# Patient Record
Sex: Female | Born: 1960 | Race: Black or African American | Hispanic: No | Smoking: Current every day smoker
Health system: Southern US, Community
[De-identification: ages and names within clinical notes are randomized; demographics above are authoritative.]

## PROBLEM LIST (undated history)

## (undated) DIAGNOSIS — I10 Essential (primary) hypertension: Secondary | ICD-10-CM

## (undated) DIAGNOSIS — E785 Hyperlipidemia, unspecified: Secondary | ICD-10-CM

## (undated) DIAGNOSIS — K5792 Diverticulitis of intestine, part unspecified, without perforation or abscess without bleeding: Secondary | ICD-10-CM

## (undated) DIAGNOSIS — K635 Polyp of colon: Secondary | ICD-10-CM

## (undated) DIAGNOSIS — Z8619 Personal history of other infectious and parasitic diseases: Secondary | ICD-10-CM

## (undated) DIAGNOSIS — E119 Type 2 diabetes mellitus without complications: Secondary | ICD-10-CM

## (undated) HISTORY — PX: REDUCTION MAMMAPLASTY: SUR839

## (undated) HISTORY — DX: Polyp of colon: K63.5

## (undated) HISTORY — PX: BREAST REDUCTION SURGERY: SHX8

## (undated) HISTORY — DX: Essential (primary) hypertension: I10

## (undated) HISTORY — DX: Type 2 diabetes mellitus without complications: E11.9

## (undated) HISTORY — DX: Diverticulitis of intestine, part unspecified, without perforation or abscess without bleeding: K57.92

## (undated) HISTORY — DX: Hyperlipidemia, unspecified: E78.5

## (undated) HISTORY — PX: ABDOMINAL HYSTERECTOMY: SHX81

## (undated) HISTORY — DX: Personal history of other infectious and parasitic diseases: Z86.19

---

## 2017-11-09 LAB — LIPID PANEL
CHOLESTEROL: 157 (ref 0–200)
HDL: 66 (ref 35–70)
LDL Cholesterol: 75
Triglycerides: 80 (ref 40–160)

## 2017-11-09 LAB — HEMOGLOBIN A1C: Hemoglobin A1C: 5.5

## 2018-03-25 ENCOUNTER — Encounter: Payer: Self-pay | Admitting: Family Medicine

## 2018-03-25 ENCOUNTER — Ambulatory Visit: Payer: BC Managed Care – PPO | Admitting: Family Medicine

## 2018-03-25 VITALS — BP 138/76 | HR 79 | Temp 98.2°F | Ht 64.0 in | Wt 158.0 lb

## 2018-03-25 DIAGNOSIS — L659 Nonscarring hair loss, unspecified: Secondary | ICD-10-CM

## 2018-03-25 DIAGNOSIS — Z1159 Encounter for screening for other viral diseases: Secondary | ICD-10-CM

## 2018-03-25 DIAGNOSIS — Z23 Encounter for immunization: Secondary | ICD-10-CM

## 2018-03-25 DIAGNOSIS — E119 Type 2 diabetes mellitus without complications: Secondary | ICD-10-CM | POA: Diagnosis not present

## 2018-03-25 DIAGNOSIS — E785 Hyperlipidemia, unspecified: Secondary | ICD-10-CM | POA: Diagnosis not present

## 2018-03-25 DIAGNOSIS — E1169 Type 2 diabetes mellitus with other specified complication: Secondary | ICD-10-CM | POA: Insufficient documentation

## 2018-03-25 DIAGNOSIS — E1159 Type 2 diabetes mellitus with other circulatory complications: Secondary | ICD-10-CM

## 2018-03-25 DIAGNOSIS — I152 Hypertension secondary to endocrine disorders: Secondary | ICD-10-CM

## 2018-03-25 DIAGNOSIS — R6889 Other general symptoms and signs: Secondary | ICD-10-CM

## 2018-03-25 DIAGNOSIS — Z1239 Encounter for other screening for malignant neoplasm of breast: Secondary | ICD-10-CM

## 2018-03-25 DIAGNOSIS — I10 Essential (primary) hypertension: Secondary | ICD-10-CM

## 2018-03-25 LAB — CBC
HCT: 39.3 % (ref 36.0–46.0)
Hemoglobin: 12.9 g/dL (ref 12.0–15.0)
MCHC: 32.7 g/dL (ref 30.0–36.0)
MCV: 84.9 fl (ref 78.0–100.0)
Platelets: 230 K/uL (ref 150.0–400.0)
RBC: 4.63 Mil/uL (ref 3.87–5.11)
RDW: 12.5 % (ref 11.5–15.5)
WBC: 6.3 K/uL (ref 4.0–10.5)

## 2018-03-25 LAB — TSH: TSH: 1.49 u[IU]/mL (ref 0.35–4.50)

## 2018-03-25 LAB — HEMOGLOBIN A1C: Hgb A1c MFr Bld: 5.3 % (ref 4.6–6.5)

## 2018-03-25 MED ORDER — LISINOPRIL-HYDROCHLOROTHIAZIDE 20-12.5 MG PO TABS: 1.0000 | ORAL_TABLET | Freq: Every day | ORAL | 3 refills | Status: DC

## 2018-03-25 MED ORDER — ATORVASTATIN CALCIUM 80 MG PO TABS: 80.0000 mg | ORAL_TABLET | Freq: Every day | ORAL | 3 refills | Status: DC

## 2018-03-25 MED ORDER — METFORMIN HCL 500 MG PO TABS: 500.0000 mg | ORAL_TABLET | Freq: Two times a day (BID) | ORAL | 3 refills | Status: DC

## 2018-03-25 NOTE — Patient Instructions (Signed)
We will check som labs today.   If you sign up for MyChart you can see your records  Everything looks great today!

## 2018-03-25 NOTE — Assessment & Plan Note (Signed)
Cold intolerance and hair loss concerning for possible thyroid disorder. Pt with hx of anemia and would like that checked as well - though she has a hysterectomy so this may be less likely

## 2018-03-25 NOTE — Assessment & Plan Note (Signed)
BP at goal. Will obtain outside labs.

## 2018-03-25 NOTE — Assessment & Plan Note (Signed)
Reports good control. Discussed the possibility of stopping medication if level is well into normal range for trial of diet controlled and she was interested. HgbA1c today.

## 2018-03-25 NOTE — Progress Notes (Signed)
Subjective:     Andrea Rivas is a 58 y.o. female presenting for Establish Care (previous PCP with [facility] last office visit was around October or November 2019.); Constipation (sometimes has to have a laxative to help use the bathroom.); and Lab work (discuss if certain levels can be checked.)     HPI   #Hair loss - has noticed that she is loosing hair - has noticed that she is getting chills - itchy skin   #Constipation - taking sennoside over the count - drinks 1/2 gal of water a day - interested in alternatives   #HTN - taking medication - no side effects  #diabetes - following the diet - has been low in the past - tolerating metformin   Review of Systems  Constitutional: Positive for chills. Negative for fatigue and unexpected weight change.  HENT: Negative for congestion, rhinorrhea and sore throat.   Eyes: Negative for visual disturbance.  Respiratory: Negative for shortness of breath.   Cardiovascular: Positive for palpitations. Negative for chest pain and leg swelling.  Gastrointestinal: Positive for constipation. Negative for blood in stool, diarrhea and vomiting.  Endocrine: Positive for cold intolerance. Negative for heat intolerance, polydipsia and polyuria.  Genitourinary: Negative.  Negative for hematuria.  Musculoskeletal: Negative for arthralgias and myalgias.  Neurological: Negative for light-headedness and headaches.  Psychiatric/Behavioral: The patient is not nervous/anxious.      Social History   Tobacco Use  Smoking Status Current Every Day Smoker  . Packs/day: 0.50  . Years: 30.00  . Pack years: 15.00  . Types: Cigarettes  Smokeless Tobacco Former Neurosurgeon  . Types: Snuff, Chew  . Quit date: 03/13/1980        Objective:    BP Readings from Last 3 Encounters:  03/25/18 138/76   Wt Readings from Last 3 Encounters:  03/25/18 158 lb (71.7 kg)    BP 138/76   Pulse 79   Temp 98.2 F (36.8 C)   Ht 5\' 4"   (1.626 m)   Wt 158 lb (71.7 kg)   SpO2 96%   BMI 27.12 kg/m    Physical Exam Constitutional:      General: She is not in acute distress.    Appearance: She is well-developed. She is not diaphoretic.  HENT:     Right Ear: External ear normal.     Left Ear: External ear normal.     Nose: Nose normal.  Eyes:     Conjunctiva/sclera: Conjunctivae normal.  Neck:     Musculoskeletal: Neck supple.     Thyroid: No thyroid mass, thyromegaly or thyroid tenderness.  Cardiovascular:     Rate and Rhythm: Normal rate and regular rhythm.     Heart sounds: No murmur.  Pulmonary:     Effort: Pulmonary effort is normal. No respiratory distress.     Breath sounds: Normal breath sounds.  Skin:    General: Skin is warm and dry.     Capillary Refill: Capillary refill takes less than 2 seconds.  Neurological:     Mental Status: She is alert. Mental status is at baseline.  Psychiatric:        Mood and Affect: Mood normal.        Behavior: Behavior normal.           Assessment & Plan:   Problem List Items Addressed This Visit      Cardiovascular and Mediastinum   Hypertension associated with diabetes (HCC)    BP at goal. Will obtain  outside labs.       Relevant Medications   aspirin EC 81 MG tablet   lisinopril-hydrochlorothiazide (PRINZIDE,ZESTORETIC) 20-12.5 MG tablet   metFORMIN (GLUCOPHAGE) 500 MG tablet   atorvastatin (LIPITOR) 80 MG tablet     Endocrine   Diabetes (HCC)    Reports good control. Discussed the possibility of stopping medication if level is well into normal range for trial of diet controlled and she was interested. HgbA1c today.       Relevant Medications   aspirin EC 81 MG tablet   lisinopril-hydrochlorothiazide (PRINZIDE,ZESTORETIC) 20-12.5 MG tablet   metFORMIN (GLUCOPHAGE) 500 MG tablet   atorvastatin (LIPITOR) 80 MG tablet   Other Relevant Orders   Hemoglobin A1c     Other   Hyperlipidemia    Cont med. Will follow-up outside labs      Relevant  Medications   aspirin EC 81 MG tablet   lisinopril-hydrochlorothiazide (PRINZIDE,ZESTORETIC) 20-12.5 MG tablet   atorvastatin (LIPITOR) 80 MG tablet   Cold intolerance    Cold intolerance and hair loss concerning for possible thyroid disorder. Pt with hx of anemia and would like that checked as well - though she has a hysterectomy so this may be less likely      Relevant Orders   CBC    Other Visit Diagnoses    Hair loss    -  Primary   Relevant Orders   TSH   Screening for breast cancer       Relevant Orders   MM 3D SCREEN BREAST BILATERAL   Need for hepatitis C screening test       Relevant Orders   Hepatitis C antibody       Return in about 6 months (around 09/23/2018).  Lynnda ChildJessica R Oluwadamilola Rosamond, MD

## 2018-03-25 NOTE — Assessment & Plan Note (Signed)
Cont med. Will follow-up outside labs

## 2018-03-26 LAB — HEPATITIS C ANTIBODY
Hepatitis C Ab: NONREACTIVE
SIGNAL TO CUT-OFF: 0.03 (ref <1.00)

## 2018-09-23 ENCOUNTER — Ambulatory Visit: Payer: BC Managed Care – PPO | Admitting: Family Medicine

## 2018-09-23 ENCOUNTER — Other Ambulatory Visit: Payer: Self-pay

## 2018-09-23 ENCOUNTER — Encounter: Payer: Self-pay | Admitting: Family Medicine

## 2018-09-23 VITALS — BP 148/86 | HR 69 | Temp 98.5°F | Ht 64.0 in | Wt 159.8 lb

## 2018-09-23 DIAGNOSIS — F17200 Nicotine dependence, unspecified, uncomplicated: Secondary | ICD-10-CM | POA: Diagnosis not present

## 2018-09-23 DIAGNOSIS — K5900 Constipation, unspecified: Secondary | ICD-10-CM

## 2018-09-23 DIAGNOSIS — E785 Hyperlipidemia, unspecified: Secondary | ICD-10-CM

## 2018-09-23 DIAGNOSIS — I10 Essential (primary) hypertension: Secondary | ICD-10-CM

## 2018-09-23 DIAGNOSIS — E1159 Type 2 diabetes mellitus with other circulatory complications: Secondary | ICD-10-CM | POA: Diagnosis not present

## 2018-09-23 DIAGNOSIS — E119 Type 2 diabetes mellitus without complications: Secondary | ICD-10-CM

## 2018-09-23 DIAGNOSIS — K589 Irritable bowel syndrome without diarrhea: Secondary | ICD-10-CM | POA: Insufficient documentation

## 2018-09-23 LAB — BASIC METABOLIC PANEL WITH GFR
BUN: 12 mg/dL (ref 6–23)
CO2: 29 meq/L (ref 19–32)
Calcium: 9.5 mg/dL (ref 8.4–10.5)
Chloride: 98 meq/L (ref 96–112)
Creatinine, Ser: 0.78 mg/dL (ref 0.40–1.20)
GFR: 92.03 mL/min
Glucose, Bld: 91 mg/dL (ref 70–99)
Potassium: 3.8 meq/L (ref 3.5–5.1)
Sodium: 137 meq/L (ref 135–145)

## 2018-09-23 MED ORDER — LISINOPRIL-HYDROCHLOROTHIAZIDE 20-12.5 MG PO TABS: 2.0000 | ORAL_TABLET | Freq: Every day | ORAL | 3 refills | Status: DC

## 2018-09-23 NOTE — Assessment & Plan Note (Signed)
Pt planning to work on cutting back

## 2018-09-23 NOTE — Progress Notes (Signed)
Subjective:     Andrea Rivas is a 58 y.o. female presenting for Follow-up (6 months ago)     HPI   #HTN - bp high today - does not check at home - no cp, vision changes - mild HA - taking medication - no issues with the medication   #DM - doing well - still taking metformin - tries to walk everyday  #constipation - hx of diverticulitis - taking [employer] version of miralax - water intake - does 3 20 oz bottles a day - drinks diet tea as well  #Tobacco use - planning to cut back  Review of Systems See HPI  Social History   Tobacco Use  Smoking Status Current Every Day Smoker  . Packs/day: 0.50  . Years: 30.00  . Pack years: 15.00  . Types: Cigarettes  Smokeless Tobacco Former NeurosurgeonUser  . Types: Snuff, Chew  . Quit date: 03/13/1980        Objective:    BP Readings from Last 3 Encounters:  09/23/18 (!) 148/86  03/25/18 138/76   Wt Readings from Last 3 Encounters:  09/23/18 159 lb 12 oz (72.5 kg)  03/25/18 158 lb (71.7 kg)    BP (!) 148/86   Pulse 69   Temp 98.5 F (36.9 C)   Ht 5\' 4"  (1.626 m)   Wt 159 lb 12 oz (72.5 kg)   SpO2 97%   BMI 27.42 kg/m    Physical Exam Constitutional:      General: She is not in acute distress.    Appearance: She is well-developed. She is not diaphoretic.  HENT:     Right Ear: External ear normal.     Left Ear: External ear normal.     Nose: Nose normal.  Eyes:     Conjunctiva/sclera: Conjunctivae normal.  Neck:     Musculoskeletal: Neck supple.  Cardiovascular:     Rate and Rhythm: Normal rate and regular rhythm.     Heart sounds: No murmur.  Pulmonary:     Effort: Pulmonary effort is normal. No respiratory distress.     Breath sounds: Normal breath sounds. No wheezing.  Skin:    General: Skin is warm and dry.     Capillary Refill: Capillary refill takes less than 2 seconds.  Neurological:     Mental Status: She is alert. Mental status is at baseline.  Psychiatric:        Mood and Affect: Mood  normal.        Behavior: Behavior normal.           Assessment & Plan:   Problem List Items Addressed This Visit      Cardiovascular and Mediastinum   Hypertension associated with diabetes (HCC) - Primary    BP a little elevated today. Still smoking. Encouraged cessation and discussed increasing lisinopril-HCTZ to 2 tablets (40-25mg ). Monitor for low bp and call back if low.      Relevant Medications   lisinopril-hydrochlorothiazide (ZESTORETIC) 20-12.5 MG tablet   Other Relevant Orders   Basic metabolic panel     Endocrine   Diabetes (HCC)    Last Hgb A1c was 5.3. Discussed stopping metformin and continuing diet/exercise. Will repeat in 6 months      Relevant Medications   lisinopril-hydrochlorothiazide (ZESTORETIC) 20-12.5 MG tablet     Other   Hyperlipidemia    Previous labs looked good. Has been on ASA x 10 years so discussed stopping. Continue lipitor      Relevant Medications  lisinopril-hydrochlorothiazide (ZESTORETIC) 20-12.5 MG tablet   Tobacco use disorder    Pt planning to work on cutting back      Constipation    Encouraged water, fiber, and stool softeners prn          Return in about 6 months (around 03/26/2019) for annual visit.  Lesleigh Noe, MD

## 2018-09-23 NOTE — Patient Instructions (Addendum)
#  constipation - Ok to use miralax - can use mag citrate if severe symptoms - make sure you drink lots of water  #Blood pressure - start taking 2 pills per day - check blood pressure if you can - monitor for lightheadedness, dizziness  #Diabetes - continue to exercise - stop the metformin - we will repeat labs in 6 months

## 2018-09-23 NOTE — Assessment & Plan Note (Signed)
Last Hgb A1c was 5.3. Discussed stopping metformin and continuing diet/exercise. Will repeat in 6 months

## 2018-09-23 NOTE — Assessment & Plan Note (Signed)
Previous labs looked good. Has been on ASA x 10 years so discussed stopping. Continue lipitor

## 2018-09-23 NOTE — Assessment & Plan Note (Signed)
BP a little elevated today. Still smoking. Encouraged cessation and discussed increasing lisinopril-HCTZ to 2 tablets (40-25mg ). Monitor for low bp and call back if low.

## 2018-09-23 NOTE — Assessment & Plan Note (Signed)
Encouraged water, fiber, and stool softeners prn

## 2018-12-17 NOTE — Progress Notes (Signed)
Andrea Rivas T. [name], MD Primary Care and Sports Medicine [facility] at [facility] [address] [place], [address] Phone: [phone]  FAX: [phone]  [name] - 58 y.o. female  MRN [id number]  Date of Birth: 1961/01/18  Visit Date: 12/18/2018  PCP: Lynnda Child, MD  Referred by: Lynnda Child, MD  Chief Complaint  Patient presents with  . Knee Pain    Left   Subjective:   Andrea Rivas is a 58 y.o. very pleasant female patient with Body mass index is 26.95 kg/m. who presents with the following:  [name] is a nice patient she presents with some knee pain on the Left.  She has not had any kind of specific injury.  She has no history of prior significant knee problems including no surgery, fractures, or other significant injuries.  She has had an insidious onset of left-sided knee pain.  She notices that she has puffiness in the posterior aspect of her knee, this can be variable depending on what she does.  No mechanical symptoms and no functional giving way.  She is not clear if this is worsened by going up and down stairs or getting in and out of a car.  A lot of throbbng.  Will have some pain in back. No major injuries broken.     Past Medical History, Surgical History, Social History, Family History, Problem List, Medications, and Allergies have been reviewed and updated if relevant.  Patient Active Problem List   Diagnosis Date Noted  . Tobacco use disorder 09/23/2018  . Constipation 09/23/2018  . Irritable bowel syndrome (IBS) 09/23/2018  . Hypertension associated with diabetes (HCC) 03/25/2018  . Diabetes (HCC) 03/25/2018  . Hyperlipidemia 03/25/2018  . Cold intolerance 03/25/2018    Past Medical History:  Diagnosis Date  . Benign colon polyp   . Diabetes mellitus without complication (HCC)   . Diverticulitis   . History of chicken pox   . Hyperlipidemia   . Hypertension     Past Surgical History:  Procedure  Laterality Date  . ABDOMINAL HYSTERECTOMY     still has both ovaries, cervix removed  . BREAST REDUCTION SURGERY Bilateral   . CESAREAN SECTION  1982    Social History   Socioeconomic History  . Marital status: Widowed    Spouse name: Not on file  . Number of children: 2  . Years of education: High school  . Highest education level: Not on file  Occupational History  . Not on file  Social Needs  . Financial resource strain: Not hard at all  . Food insecurity    Worry: Not on file    Inability: Not on file  . Transportation needs    Medical: Not on file    Non-medical: Not on file  Tobacco Use  . Smoking status: Current Every Day Smoker    Packs/day: 0.50    Years: 30.00    Pack years: 15.00    Types: Cigarettes  . Smokeless tobacco: Former Neurosurgeon    Types: Snuff, Dorna Bloom    Quit date: 03/13/1980  Substance and Sexual Activity  . Alcohol use: Yes    Comment: weekend, 3, 16 oz beers  . Drug use: Never  . Sexual activity: Not Currently  Lifestyle  . Physical activity    Days per week: Not on file    Minutes per session: Not on file  . Stress: Not on file  Relationships  . Social connections  Talks on phone: Not on file    Gets together: Not on file    Attends religious service: Not on file    Active member of club or organization: Not on file    Attends meetings of clubs or organizations: Not on file    Relationship status: Not on file  . Intimate partner violence    Fear of current or ex partner: Not on file    Emotionally abused: Not on file    Physically abused: Not on file    Forced sexual activity: Not on file  Other Topics Concern  . Not on file  Social History Narrative   Lives with Daughter - Randell Patient and Son Maryan Char - good relationship   5 Grandchildren - 3 girls and 2 boys age range 2-17   Enjoys: playing bingo, fishing   Retired - from school - worked as Educational psychologist for 32 years   Exercise: walking   Diet: eats a large lunch, tries to do low carb diabetic  diet    Family History  Problem Relation Age of Onset  . Diabetes Mother   . Hyperlipidemia Mother   . Hypertension Mother   . Kidney failure Mother   . Rectal cancer Mother 35  . Prostate cancer Father   . Diabetes Sister   . Hyperlipidemia Sister   . Kidney disease Brother   . Hyperlipidemia Brother   . Drug abuse Brother   . Diabetes Brother   . Alcohol abuse Brother   . Alcohol abuse Brother   . Drug abuse Brother     Allergies  Allergen Reactions  . Tramadol Itching    Medication list reviewed and updated in full in [facility].  GEN: No fevers, chills. Nontoxic. Primarily MSK c/o today. MSK: Detailed in the HPI GI: tolerating PO intake without difficulty Neuro: No numbness, parasthesias, or tingling associated. Otherwise the pertinent positives of the ROS are noted above.   Objective:   BP 124/74   Pulse 82   Temp 98.1 F (36.7 C) (Temporal)   Ht 5\' 4"  (1.626 m)   Wt 157 lb (71.2 kg)   SpO2 98%   BMI 26.95 kg/m    GEN: WDWN, NAD, Non-toxic, Alert & Oriented x 3 HEENT: Atraumatic, Normocephalic.  Ears and Nose: No external deformity. EXTR: No clubbing/cyanosis/edema NEURO: Normal gait.  PSYCH: Normally interactive. Conversant. Not depressed or anxious appearing.  Calm demeanor.    Full extension of the left knee.  Flexion to 130 degrees.  Stable to varus and valgus stress.  No pain with patellar manipulation.  Minimal joint line tenderness.  No lateral joint line tenderness.  ACL and PCL are intact.  She does have some pain with flexion pinch and McMurray's, and she does have a notable Baker's cyst posteriorly.  Radiology: No results found.  Assessment and Plan:     ICD-10-CM   1. Baker cyst, left  M71.22 DG Knee 4 Views W/Patella Left  2. Acute pain of left knee  M25.562 DG Knee 4 Views W/Patella Left  3. Primary osteoarthritis of left knee  M17.12    Patient does have mild to moderate osteoarthritis of the knee on plain film.  Reviewed  conservative care with the patient regarding Baker's cyst as well as osteoarthritis.  For now she wants to be quite conservative, and I recommended she take some NSAIDs versus Tylenol before exercise.  Also think it be helpful to use a compression brace to help with popliteal swelling post exercise.  If she becomes more symptomatic or would like to follow-up, next logical step would be to do an intra-articular injection.  Follow-up: No follow-ups on file.  No orders of the defined types were placed in this encounter.  Orders Placed This Encounter  Procedures  . DG Knee 4 Views W/Patella Left    Signed,  Soraya Paquette T. Zohal Reny, MD   Outpatient Encounter Medications as of 12/18/2018  Medication Sig  . atorvastatin (LIPITOR) 80 MG tablet Take 1 tablet (80 mg total) by mouth daily.  Marland Kitchen. lisinopril-hydrochlorothiazide (ZESTORETIC) 20-12.5 MG tablet Take 2 tablets by mouth daily.  . Multiple Vitamins-Minerals (CENTRUM SILVER PO) Take 1 tablet by mouth daily.   No facility-administered encounter medications on file as of 12/18/2018.

## 2018-12-18 ENCOUNTER — Ambulatory Visit: Payer: BC Managed Care – PPO | Admitting: Family Medicine

## 2018-12-18 ENCOUNTER — Other Ambulatory Visit: Payer: Self-pay

## 2018-12-18 ENCOUNTER — Encounter: Payer: Self-pay | Admitting: Family Medicine

## 2018-12-18 ENCOUNTER — Ambulatory Visit (INDEPENDENT_AMBULATORY_CARE_PROVIDER_SITE_OTHER)
Admission: RE | Admit: 2018-12-18 | Discharge: 2018-12-18 | Disposition: A | Discharge status: Home / Self Care | Payer: BC Managed Care – PPO | Source: Ambulatory Visit | Attending: Family Medicine | Admitting: Family Medicine

## 2018-12-18 VITALS — BP 124/74 | HR 82 | Temp 98.1°F | Ht 64.0 in | Wt 157.0 lb

## 2018-12-18 DIAGNOSIS — M7122 Synovial cyst of popliteal space [Baker], left knee: Secondary | ICD-10-CM

## 2018-12-18 DIAGNOSIS — M25562 Pain in left knee: Secondary | ICD-10-CM

## 2018-12-18 DIAGNOSIS — M1712 Unilateral primary osteoarthritis, left knee: Secondary | ICD-10-CM | POA: Diagnosis not present

## 2019-04-04 ENCOUNTER — Other Ambulatory Visit: Payer: BC Managed Care – PPO

## 2019-04-04 ENCOUNTER — Ambulatory Visit: Payer: BC Managed Care – PPO | Attending: Internal Medicine

## 2019-04-04 DIAGNOSIS — Z20822 Contact with and (suspected) exposure to covid-19: Secondary | ICD-10-CM

## 2019-04-06 LAB — NOVEL CORONAVIRUS, NAA: SARS-CoV-2, NAA: NOT DETECTED

## 2019-05-09 ENCOUNTER — Telehealth: Payer: Self-pay | Admitting: Family Medicine

## 2019-05-09 DIAGNOSIS — E785 Hyperlipidemia, unspecified: Secondary | ICD-10-CM

## 2019-05-09 DIAGNOSIS — E1159 Type 2 diabetes mellitus with other circulatory complications: Secondary | ICD-10-CM

## 2019-05-09 MED ORDER — LISINOPRIL-HYDROCHLOROTHIAZIDE 20-12.5 MG PO TABS: 2.0000 | ORAL_TABLET | Freq: Every day | ORAL | 0 refills | Status: DC

## 2019-05-09 MED ORDER — ATORVASTATIN CALCIUM 80 MG PO TABS: 80.0000 mg | ORAL_TABLET | Freq: Every day | ORAL | 0 refills | Status: DC

## 2019-05-09 NOTE — Telephone Encounter (Signed)
Patient called.  Patient has changed pharmacies from [facility] to [facility]. Patient needs a refill on Atorvastatin and Lisinopril-HCTZ. Pharmacy told her she needed to call PCP for refills since she's changing pharmacies.

## 2019-05-09 NOTE — Telephone Encounter (Signed)
Spoke with patient and advised that refills have been sent in but also patient is due for CPE and labs. Patient had lab appointment scheduled for 3/1 but I advised that she was actually due for an appointment and then we can order labs as needed. I cancelled lab appointment.  FYI to PCP

## 2019-05-12 ENCOUNTER — Other Ambulatory Visit: Payer: BC Managed Care – PPO

## 2019-05-20 ENCOUNTER — Ambulatory Visit (INDEPENDENT_AMBULATORY_CARE_PROVIDER_SITE_OTHER): Payer: BC Managed Care – PPO | Admitting: Family Medicine

## 2019-05-20 ENCOUNTER — Encounter: Payer: Self-pay | Admitting: Family Medicine

## 2019-05-20 ENCOUNTER — Other Ambulatory Visit: Payer: Self-pay

## 2019-05-20 VITALS — BP 138/82 | HR 83 | Temp 97.7°F | Ht 63.5 in | Wt 163.2 lb

## 2019-05-20 DIAGNOSIS — E1159 Type 2 diabetes mellitus with other circulatory complications: Secondary | ICD-10-CM | POA: Diagnosis not present

## 2019-05-20 DIAGNOSIS — Z136 Encounter for screening for cardiovascular disorders: Secondary | ICD-10-CM | POA: Diagnosis not present

## 2019-05-20 DIAGNOSIS — I152 Hypertension secondary to endocrine disorders: Secondary | ICD-10-CM

## 2019-05-20 DIAGNOSIS — I10 Essential (primary) hypertension: Secondary | ICD-10-CM

## 2019-05-20 DIAGNOSIS — E785 Hyperlipidemia, unspecified: Secondary | ICD-10-CM

## 2019-05-20 DIAGNOSIS — Z13228 Encounter for screening for other metabolic disorders: Secondary | ICD-10-CM

## 2019-05-20 DIAGNOSIS — E119 Type 2 diabetes mellitus without complications: Secondary | ICD-10-CM | POA: Diagnosis not present

## 2019-05-20 LAB — COMPREHENSIVE METABOLIC PANEL
ALT: 17 U/L (ref 0–35)
AST: 18 U/L (ref 0–37)
Albumin: 4.5 g/dL (ref 3.5–5.2)
Alkaline Phosphatase: 82 U/L (ref 39–117)
BUN: 14 mg/dL (ref 6–23)
CO2: 30 mEq/L (ref 19–32)
Calcium: 9.6 mg/dL (ref 8.4–10.5)
Chloride: 99 mEq/L (ref 96–112)
Creatinine, Ser: 0.85 mg/dL (ref 0.40–1.20)
GFR: 83.15 mL/min (ref >60.00)
Glucose, Bld: 115 mg/dL — ABNORMAL HIGH (ref 70–99)
Potassium: 3.8 mEq/L (ref 3.5–5.1)
Sodium: 137 mEq/L (ref 135–145)
Total Bilirubin: 0.7 mg/dL (ref 0.2–1.2)
Total Protein: 7.2 g/dL (ref 6.0–8.3)

## 2019-05-20 LAB — LIPID PANEL
Cholesterol: 211 mg/dL — ABNORMAL HIGH (ref 0–200)
HDL: 74.5 mg/dL (ref >39.00)
LDL Cholesterol: 102 mg/dL — ABNORMAL HIGH (ref 0–99)
NonHDL: 136.33
Total CHOL/HDL Ratio: 3
Triglycerides: 173 mg/dL — ABNORMAL HIGH (ref 0.0–149.0)
VLDL: 34.6 mg/dL (ref 0.0–40.0)

## 2019-05-20 LAB — HEMOGLOBIN A1C: Hgb A1c MFr Bld: 5.6 % (ref 4.6–6.5)

## 2019-05-20 MED ORDER — LISINOPRIL-HYDROCHLOROTHIAZIDE 20-12.5 MG PO TABS: 2.0000 | ORAL_TABLET | Freq: Every day | ORAL | 0 refills | Status: DC

## 2019-05-20 MED ORDER — ATORVASTATIN CALCIUM 80 MG PO TABS: 80.0000 mg | ORAL_TABLET | Freq: Every day | ORAL | 0 refills | Status: DC

## 2019-05-20 NOTE — Progress Notes (Signed)
Annual Exam   Chief Complaint:  Chief Complaint  Patient presents with  . Annual Exam    History of Present Illness:  [name] is a 59 y.o. No obstetric history on file. who LMP was No LMP recorded. Patient has had a hysterectomy., presents today for her annual examination.    #diabetes - currently diet controled - will occasionally take a pill if her numbers are higher   Nutrition She does get adequate calcium and Vitamin D in her diet. Diet: trying to do diabetic diet Taking complete vitamins - noticed that she is hungry at night Exercise: has not started   Safety The patient wears seatbelts: yes.     The patient feels safe at home and in their relationships: yes.   Menstrual S/p hysterectomy  GYN She is not sexually active.    Cervical Cancer Screening:   S/p cervix removed  Breast Cancer Screening There is no FH of breast cancer. There is no FH of ovarian cancer. BRCA screening Not Indicated.  Last Mammogram: "been a while" The patient does want a mammogram this year.    Colon Cancer Screening Age 43-75 yo - benefits outweigh the risk. Adults 76-85 yo who have never been screened benefit.  Benefits: 134000 people in 2016 will be diagnosed and 49,000 will die - early detection helps [place]: Complications 2/2 to colonoscopy High Risk (Colonoscopy): genetic disorder (Lynch syndrome or familial adenomatous polyposis), personal hx of IBD, previous adenomatous polyp, or previous colorectal cancer, FamHx start 10 years before the age at diagnosis, increased in males and black race  Options:  FIT - looks for hemoglobin (blood in the stool) - specific and fairly sensitive - must be done annually Cologuard - looks for DNA and blood - more sensitive - therefore can have more false positives, every 3 years Colonoscopy - every 10 years if normal - sedation, bowl prep, must have someone drive you  Shared decision making and the patient had decided to do  colonoscopy.  Had a colonoscopy - in [place] Screening Annual screening for adults age 19-80 yo with 30 year pack history? No Current Tobacco user? Yes Quit less than 15 years ago? No Interested in low dose CT for lung cancer screening? not applicable  Weight Wt Readings from Last 3 Encounters:  05/20/19 163 lb 4 oz (74 kg)  12/18/18 157 lb (71.2 kg)  09/23/18 159 lb 12 oz (72.5 kg)   Patient has high BMI  BMI Readings from Last 1 Encounters:  05/20/19 28.46 kg/m     Chronic disease screening Blood pressure monitoring:  BP Readings from Last 3 Encounters:  05/20/19 138/82  12/18/18 124/74  09/23/18 (!) 148/86    Lipid Monitoring: Indication for screening: age >35, obesity, diabetes, family hx, CV risk factors.  Lipid screening: Yes  Lab Results  Component Value Date   CHOL 157 11/08/2017   HDL 66 11/08/2017   LDLCALC 75 11/08/2017   TRIG 80 11/08/2017     Diabetes Screening: age >36, overweight, family hx, PCOS, hx of gestational diabetes, at risk ethnicity Diabetes Screening screening: Yes  Lab Results  Component Value Date   HGBA1C 5.3 03/25/2018     Past Medical History:  Diagnosis Date  . Benign colon polyp   . Diabetes mellitus without complication ([place])   . Diverticulitis   . History of chicken pox   . Hyperlipidemia   . Hypertension     Past Surgical History:  Procedure Laterality Date  .  ABDOMINAL HYSTERECTOMY     still has both ovaries, cervix removed  . BREAST REDUCTION SURGERY Bilateral   . CESAREAN SECTION  1982    Prior to Admission medications   Medication Sig Start Date End Date Taking? Authorizing Provider  atorvastatin (LIPITOR) 80 MG tablet Take 1 tablet (80 mg total) by mouth daily. 05/09/19  Yes Lesleigh Noe, MD  lisinopril-hydrochlorothiazide (ZESTORETIC) 20-12.5 MG tablet Take 2 tablets by mouth daily. 05/09/19  Yes Lesleigh Noe, MD  Multiple Vitamins-Minerals (CENTRUM SILVER PO) Take 1 tablet by  mouth daily.   Yes [provider]    Allergies  Allergen Reactions  . Tramadol Itching    Gynecologic History: No LMP recorded. Patient has had a hysterectomy.  Obstetric History: No obstetric history on file.  Social History   Socioeconomic History  . Marital status: Widowed    Spouse name: Not on file  . Number of children: 2  . Years of education: High school  . Highest education level: Not on file  Occupational History  . Not on file  Tobacco Use  . Smoking status: Current Every Day Smoker    Packs/day: 0.50    Years: 30.00    Pack years: 15.00    Types: Cigarettes  . Smokeless tobacco: Former Systems developer    Types: Snuff, Sarina Ser    Quit date: 03/13/1980  Substance and Sexual Activity  . Alcohol use: Yes    Comment: weekend, 3, 16 oz beers  . Drug use: Never  . Sexual activity: Not Currently  Other Topics Concern  . Not on file  Social History Narrative   Lives with Daughter - Randell Patient and Son Maryan Char - good relationship   5 Grandchildren - 3 girls and 2 boys age range 2-17   Enjoys: playing bingo, fishing   Retired - from school - worked as Educational psychologist for 32 years   Exercise: walking   Diet: eats a large lunch, tries to do low carb diabetic diet   Social Determinants of Radio broadcast assistant Strain:   . Difficulty of Paying Living Expenses: Not on file  Food Insecurity:   . Worried About Charity fundraiser in the Last Year: Not on file  . Ran Out of Food in the Last Year: Not on file  Transportation Needs:   . Lack of Transportation (Medical): Not on file  . Lack of Transportation (Non-Medical): Not on file  Physical Activity:   . Days of Exercise per Week: Not on file  . Minutes of Exercise per Session: Not on file  Stress:   . Feeling of Stress : Not on file  Social Connections:   . Frequency of Communication with Friends and Family: Not on file  . Frequency of Social Gatherings with Friends and Family: Not on file  . Attends Religious Services: Not  on file  . Active Member of Clubs or Organizations: Not on file  . Attends Archivist Meetings: Not on file  . Marital Status: Not on file  Intimate Partner Violence:   . Fear of Current or Ex-Partner: Not on file  . Emotionally Abused: Not on file  . Physically Abused: Not on file  . Sexually Abused: Not on file    Family History  Problem Relation Age of Onset  . Diabetes Mother   . Hyperlipidemia Mother   . Hypertension Mother   . Kidney failure Mother   . Rectal cancer Mother 69  . Prostate cancer Father   .  Diabetes Sister   . Hyperlipidemia Sister   . Kidney disease Brother   . Hyperlipidemia Brother   . Drug abuse Brother   . Diabetes Brother   . Alcohol abuse Brother   . Alcohol abuse Brother   . Drug abuse Brother     Review of Systems  Constitutional: Negative for chills and fever.  HENT: Negative for congestion and sore throat.   Eyes: Negative for blurred vision and double vision.  Respiratory: Negative for shortness of breath.   Cardiovascular: Negative for chest pain.  Gastrointestinal: Negative for heartburn, nausea and vomiting.  Genitourinary: Negative.   Musculoskeletal: Negative.  Negative for myalgias.  Skin: Negative for rash.  Neurological: Negative for dizziness and headaches.  Endo/Heme/Allergies: Does not bruise/bleed easily.  Psychiatric/Behavioral: Negative for depression. The patient is not nervous/anxious.      Physical Exam BP 138/82   Pulse 83   Temp 97.7 F (36.5 C)   Ht 5' 3.5" (1.613 m)   Wt 163 lb 4 oz (74 kg)   BMI 28.46 kg/m    BP Readings from Last 3 Encounters:  05/20/19 138/82  12/18/18 124/74  09/23/18 (!) 148/86      Physical Exam Constitutional:      General: She is not in acute distress.    Appearance: She is well-developed. She is not diaphoretic.  HENT:     Head: Normocephalic and atraumatic.     Right Ear: External ear normal.     Left Ear: External ear normal.     Nose: Nose normal.  Eyes:      General: No scleral icterus.    Conjunctiva/sclera: Conjunctivae normal.  Cardiovascular:     Rate and Rhythm: Normal rate and regular rhythm.     Heart sounds: No murmur.  Pulmonary:     Effort: Pulmonary effort is normal. No respiratory distress.     Breath sounds: Normal breath sounds. No wheezing.  Abdominal:     General: Bowel sounds are normal. There is no distension.     Palpations: Abdomen is soft. There is no mass.     Tenderness: There is no abdominal tenderness. There is no guarding or rebound.  Musculoskeletal:        General: Normal range of motion.     Cervical back: Neck supple.  Lymphadenopathy:     Cervical: No cervical adenopathy.  Skin:    General: Skin is warm and dry.     Capillary Refill: Capillary refill takes less than 2 seconds.     Comments: Area of concern on posterior neck: skin tag  Neurological:     Mental Status: She is alert and oriented to person, place, and time.     Deep Tendon Reflexes: Reflexes normal.  Psychiatric:        Behavior: Behavior normal.      Female chaperone present for pelvic and breast portions of the physical exam  Results:  PHQ-9:    Office Visit from 03/25/2018 in [place] at [facility]  PHQ-9 Total Score  0        Assessment: 59 y.o. No obstetric history on file. female here for routine annual physical examination.  Plan: Problem List Items Addressed This Visit      Cardiovascular and Mediastinum   Hypertension associated with diabetes ([place])   Relevant Medications   lisinopril-hydrochlorothiazide (ZESTORETIC) 20-12.5 MG tablet   atorvastatin (LIPITOR) 80 MG tablet     Endocrine   Diabetes ([place]) - Primary   Relevant Medications  lisinopril-hydrochlorothiazide (ZESTORETIC) 20-12.5 MG tablet   atorvastatin (LIPITOR) 80 MG tablet   Other Relevant Orders   Ambulatory referral to Ophthalmology   Hemoglobin A1c     Other   Hyperlipidemia   Relevant Medications    lisinopril-hydrochlorothiazide (ZESTORETIC) 20-12.5 MG tablet   atorvastatin (LIPITOR) 80 MG tablet    Other Visit Diagnoses    Encounter for special screening examination for cardiovascular disorder       Relevant Orders   Lipid panel   Screening for metabolic disorder       Relevant Orders   Comprehensive metabolic panel      Screening: -- Blood pressure screen normal -- cholesterol screening: will obtain -- Weight screening: overweight: continue to monitor -- Diabetes Screening: has been diet controled. repeat labs today. normal foot exam. encouraged healthy lifestyle -- Nutrition: normal   The 10-year ASCVD risk score Mikey Bussing DC Jr., et al., 2013) is: 21.6%*   Values used to calculate the score:     Age: 45 years     Sex: Female     Is Non-Hispanic African American: Yes     Diabetic: Yes     Tobacco smoker: Yes     Systolic Blood Pressure: 633 mmHg     Is BP treated: Yes     HDL Cholesterol: 66 mg/dL*     Total Cholesterol: 157 mg/dL*     * - Cholesterol units were assumed for this score calculation  -- Statin therapy for Age 62-75 with CVD risk >7.5%  Psych -- Depression screening (PHQ-9):    Office Visit from 03/25/2018 in [place] at [facility]  PHQ-9 Total Score  0       Safety -- tobacco screening: using, not interested in quitting at this time. encouraged cessation -- alcohol screening:  low-risk usage. -- no evidence of domestic violence or intimate partner violence.   Cancer Screening -- pap smear not collected per ASCCP guidelines - no longer has cervix -- family history of breast cancer screening: done. not at high risk. -- Mammogram - ordered  Immunizations -- flu vaccine declined -- TDAP q10 years unknown, record requested -- PPSV-23 (19-64 with chronic disease or smoking) unknown, record requested -- HPV vaccination series (Age <45 and at risk): not eligilbe   Encouraged regular vision and dental care. Smoking cessation and  healthy diet/exercise.   Lesleigh Noe, MD

## 2019-05-20 NOTE — Patient Instructions (Signed)
-  Go to the eye doctor - go to the dentist - let me know if you are ready to quit smoking and want some support - labs today     Menopause Menopause may increase your risk for:  Loss of bone (osteoporosis), which causes bone breaks (fractures).  Depression.  Hardening and narrowing of the arteries (atherosclerosis), which can cause heart attacks and strokes. What are the causes? This condition is usually caused by a natural change in hormone levels that happens as you get older. The condition may also be caused by surgery to remove both ovaries (bilateral oophorectomy). Follow these instructions at home: Lifestyle  Do not use any products that contain nicotine or tobacco, such as cigarettes and e-cigarettes. If you need help quitting, ask your health care provider.  Get at least 30 minutes of physical activity on 5 or more days each week.  Avoid alcoholic and caffeinated beverages, as well as spicy foods. This may help prevent hot flashes.  Get 7-8 hours of sleep each night.  If you have hot flashes, try: ? Dressing in layers. ? Avoiding things that may trigger hot flashes, such as spicy food, hot drinks, alcohol, caffeine, warm places, or stress. ? Taking slow, deep breaths when a hot flash starts. ? Keeping a fan in your home and office.  Find ways to manage stress: regular exercise, meditation, yoga, qigong, Tai Chi, biofeedback, acupuncture or massage  Consider going to group therapy with other women who are having menopause symptoms. Ask your health care provider about recommended group therapy meetings.  Staying cool while sleeping: dress in light clothing, use layed bedding that can be easily removed, Sleep with a fan nearby, put an ice pack under your pillow and flip your pillow regularly  Eating and drinking  Eat a healthy, balanced diet that contains whole grains, lean protein, low-fat dairy, and plenty of fruits and vegetables.  Your health care provider may  recommend adding more soy to your diet. Foods that contain soy include tofu, tempeh, and soy milk.  Eat plenty of foods that contain calcium and vitamin D for bone health. Items that are rich in calcium include low-fat milk, yogurt, beans, almonds, sardines, broccoli, and kale. Medicines  Non-prescription medications for Hot Flashes ? Soy - eat 1-2 servings of soy foods daily ? Herbs: like black cohosh have shown some improvement with hot flashes  Talk with your health care provider before starting any herbal supplements. If prescribed, take vitamins and supplements as told by your health care provider. These may include: ? Calcium. Women age 58 and older should get 1,200 mg (milligrams) of calcium every day. ? Vitamin D. Women need 600-800 International Units of vitamin D each day.  Prescription Medications ? Hormone Treatment: increased risk of breast cancer and cardiovascular disease. Should be used for a short time and in women under 58 have a lower risk overall if they take it ? Non-Hormone Treatment: Paroxetine which is an antidepressant, has been shown to reduce Hot Flashes

## 2019-05-21 ENCOUNTER — Encounter: Payer: Self-pay | Admitting: Family Medicine

## 2019-09-04 LAB — HM DIABETES EYE EXAM

## 2019-10-02 ENCOUNTER — Encounter (HOSPITAL_COMMUNITY): Payer: Self-pay | Admitting: Emergency Medicine

## 2019-10-02 ENCOUNTER — Other Ambulatory Visit: Payer: Self-pay

## 2019-10-02 ENCOUNTER — Emergency Department (HOSPITAL_COMMUNITY)
Admission: EM | Admit: 2019-10-02 | Discharge: 2019-10-03 | Disposition: A | Discharge status: Home / Self Care | Payer: BC Managed Care – PPO | Attending: Emergency Medicine | Admitting: Emergency Medicine

## 2019-10-02 DIAGNOSIS — F17210 Nicotine dependence, cigarettes, uncomplicated: Secondary | ICD-10-CM | POA: Diagnosis not present

## 2019-10-02 DIAGNOSIS — R42 Dizziness and giddiness: Secondary | ICD-10-CM | POA: Insufficient documentation

## 2019-10-02 DIAGNOSIS — R55 Syncope and collapse: Secondary | ICD-10-CM | POA: Diagnosis not present

## 2019-10-02 DIAGNOSIS — Z79899 Other long term (current) drug therapy: Secondary | ICD-10-CM | POA: Diagnosis not present

## 2019-10-02 DIAGNOSIS — E119 Type 2 diabetes mellitus without complications: Secondary | ICD-10-CM | POA: Insufficient documentation

## 2019-10-02 DIAGNOSIS — E876 Hypokalemia: Secondary | ICD-10-CM

## 2019-10-02 DIAGNOSIS — I10 Essential (primary) hypertension: Secondary | ICD-10-CM | POA: Insufficient documentation

## 2019-10-02 MED ORDER — SODIUM CHLORIDE 0.9% FLUSH: 3.0000 mL | Freq: Once | INTRAVENOUS | Status: DC

## 2019-10-02 NOTE — ED Triage Notes (Signed)
Per EMS, pt was w/ family, suddenly felt "sweaty" and sat down in a chair.  Pt has witnessed syncopal episode lasting 3 minutes.  Family stated she was confused when she first became alert.    EMS found her a/o X4.  She reports feeling "dizzy and nervous."  Does admit to 3 beers.   165/83 90 HR 100% RA 18 RR 20G L AC

## 2019-10-03 LAB — URINALYSIS, ROUTINE W REFLEX MICROSCOPIC
Bilirubin Urine: NEGATIVE
Glucose, UA: NEGATIVE mg/dL
Ketones, ur: NEGATIVE mg/dL
Nitrite: NEGATIVE
Protein, ur: NEGATIVE mg/dL
Specific Gravity, Urine: 1.006 (ref 1.005–1.030)
pH: 6 (ref 5.0–8.0)

## 2019-10-03 LAB — BASIC METABOLIC PANEL
Anion gap: 13 (ref 5–15)
BUN: 11 mg/dL (ref 6–20)
CO2: 23 mmol/L (ref 22–32)
Calcium: 9.6 mg/dL (ref 8.9–10.3)
Chloride: 99 mmol/L (ref 98–111)
Creatinine, Ser: 1.02 mg/dL — ABNORMAL HIGH (ref 0.44–1.00)
GFR calc Af Amer: >60 mL/min (ref >60)
GFR calc non Af Amer: >60 mL/min (ref >60)
Glucose, Bld: 142 mg/dL — ABNORMAL HIGH (ref 70–99)
Potassium: 2.9 mmol/L — ABNORMAL LOW (ref 3.5–5.1)
Sodium: 135 mmol/L (ref 135–145)

## 2019-10-03 LAB — CBC
HCT: 41.3 % (ref 36.0–46.0)
Hemoglobin: 12.5 g/dL (ref 12.0–15.0)
MCH: 26.9 pg (ref 26.0–34.0)
MCHC: 30.3 g/dL (ref 30.0–36.0)
MCV: 89 fL (ref 80.0–100.0)
Platelets: 196 10*3/uL (ref 150–400)
RBC: 4.64 MIL/uL (ref 3.87–5.11)
RDW: 12.2 % (ref 11.5–15.5)
WBC: 9.4 10*3/uL (ref 4.0–10.5)
nRBC: 0 % (ref 0.0–0.2)

## 2019-10-03 LAB — I-STAT BETA HCG BLOOD, ED (MC, WL, AP ONLY): I-stat hCG, quantitative: <5 m[IU]/mL (ref <5)

## 2019-10-03 MED ORDER — POTASSIUM CHLORIDE CRYS ER 20 MEQ PO TBCR
20.0000 meq | EXTENDED_RELEASE_TABLET | Freq: Every day | ORAL | 0 refills | Status: DC
Start: 2019-10-03 — End: 2019-10-06

## 2019-10-03 MED ORDER — POTASSIUM CHLORIDE CRYS ER 20 MEQ PO TBCR
40.0000 meq | EXTENDED_RELEASE_TABLET | Freq: Once | ORAL | Status: AC
  Administered 2019-10-03: 40 meq via ORAL
  Filled 2019-10-03: qty 2

## 2019-10-03 MED ORDER — ACETAMINOPHEN 500 MG PO TABS
1000.0000 mg | ORAL_TABLET | Freq: Once | ORAL | Status: AC
  Administered 2019-10-03: 1000 mg via ORAL
  Filled 2019-10-03: qty 2

## 2019-10-03 NOTE — Discharge Instructions (Addendum)
It was our pleasure to provide your ER care today - we hope that you feel better.  From today's labs, your potassium level is low (2.9), eat plenty of fruits and vegetables, take potassium supplement as prescribed, and follow up with primary care doctor in 1 week.  For fainting episode, follow up with your doctor/cardiologist in the coming week - discuss possible outpatient monitoring.   Return to ER if worse, new symptoms, fevers, chest pain, trouble breathing, fainting, new or severe pain, or other concern.

## 2019-10-03 NOTE — ED Notes (Signed)
Patient states she would like to be called "Andrea Rivas".

## 2019-10-03 NOTE — ED Provider Notes (Signed)
[facility] EMERGENCY DEPARTMENT Provider Note   CSN: [id number] Arrival date & time: 10/02/19  2335     History Chief Complaint  Patient presents with  . Loss of Consciousness    Andrea Rivas is a 59 y.o. female.  Patient with episode feeling lightheaded/faint last night, w brief syncopal event. Pt indicates she had begun to feel hot/warm, flushed, lightheaded. Denies trauma or injury with event. At time of event, no pain - no chest pain or discomfort, no headache. Denies associated palpitations. No fever. No sob or unusual doe. Denies hx syncope. Denies hx dysrhythmia. No hx cad or fam hx premature cad.  Denies any recent blood loss, vaginal bleeding, rectal bleeding or melena. No change in meds or new meds. Did indicate had consumed 3 beers shortly prior. Prolonged wait in ED due to boarding - all day in waiting room patient indicates she has felt fine (except hungry/thirsty now).   The history is provided by the patient and the EMS personnel.  Loss of Consciousness Associated symptoms: no chest pain, no confusion, no fever, no headaches, no palpitations, no shortness of breath, no vomiting and no weakness        Past Medical History:  Diagnosis Date  . Benign colon polyp   . Diabetes mellitus without complication (HCC)   . Diverticulitis   . History of chicken pox   . Hyperlipidemia   . Hypertension     Patient Active Problem List   Diagnosis Date Noted  . Tobacco use disorder 09/23/2018  . Constipation 09/23/2018  . Irritable bowel syndrome (IBS) 09/23/2018  . Hypertension associated with diabetes (HCC) 03/25/2018  . Diabetes (HCC) 03/25/2018  . Hyperlipidemia 03/25/2018  . Cold intolerance 03/25/2018    Past Surgical History:  Procedure Laterality Date  . ABDOMINAL HYSTERECTOMY     still has both ovaries, cervix removed  . BREAST REDUCTION SURGERY Bilateral   . CESAREAN SECTION  1982     OB History   No obstetric history on file.      Family History  Problem Relation Age of Onset  . Diabetes Mother   . Hyperlipidemia Mother   . Hypertension Mother   . Kidney failure Mother   . Rectal cancer Mother 61  . Prostate cancer Father   . Diabetes Sister   . Hyperlipidemia Sister   . Kidney disease Brother   . Hyperlipidemia Brother   . Drug abuse Brother   . Diabetes Brother   . Alcohol abuse Brother   . Alcohol abuse Brother   . Drug abuse Brother     Social History   Tobacco Use  . Smoking status: Current Every Day Smoker    Packs/day: 0.50    Years: 30.00    Pack years: 15.00    Types: Cigarettes  . Smokeless tobacco: Former Neurosurgeon    Types: Snuff, Dorna Bloom    Quit date: 03/13/1980  Vaping Use  . Vaping Use: Never used  Substance Use Topics  . Alcohol use: Yes    Comment: weekend, 3, 16 oz beers  . Drug use: Never    Home Medications Prior to Admission medications   Medication Sig Start Date End Date Taking? Authorizing Provider  atorvastatin (LIPITOR) 80 MG tablet Take 1 tablet (80 mg total) by mouth daily. 05/20/19   Lynnda Child, MD  lisinopril-hydrochlorothiazide (ZESTORETIC) 20-12.5 MG tablet Take 2 tablets by mouth daily. 05/20/19   Lynnda Child, MD  Multiple Vitamins-Minerals (CENTRUM SILVER PO) Take 1 tablet  by mouth daily.    [provider]    Allergies    Tramadol  Review of Systems   Review of Systems  Constitutional: Negative for chills and fever.  HENT: Negative for sore throat.   Eyes: Negative for pain and visual disturbance.  Respiratory: Negative for cough and shortness of breath.   Cardiovascular: Positive for syncope. Negative for chest pain, palpitations and leg swelling.  Gastrointestinal: Negative for abdominal pain, blood in stool, diarrhea and vomiting.  Endocrine: Negative for polyuria.  Genitourinary: Negative for dysuria and flank pain.  Musculoskeletal: Negative for back pain and neck pain.  Skin: Negative for rash.  Neurological: Negative for speech  difficulty, weakness, numbness and headaches.  Hematological: Does not bruise/bleed easily.  Psychiatric/Behavioral: Negative for confusion.    Physical Exam Updated Vital Signs BP (!) 135/60 (BP Location: Right Arm)   Pulse 70   Temp 98.1 F (36.7 C) (Oral)   Resp 18   Ht 1.6 m (5\' 3" )   Wt 74 kg   SpO2 100%   BMI 28.90 kg/m   Physical Exam Vitals and nursing note reviewed.  Constitutional:      Appearance: Normal appearance. She is well-developed.  HENT:     Head: Atraumatic.     Nose: Nose normal.     Mouth/Throat:     Mouth: Mucous membranes are moist.  Eyes:     General: No scleral icterus.    Conjunctiva/sclera: Conjunctivae normal.     Pupils: Pupils are equal, round, and reactive to light.  Neck:     Trachea: No tracheal deviation.  Cardiovascular:     Rate and Rhythm: Normal rate and regular rhythm.     Pulses: Normal pulses.     Heart sounds: Normal heart sounds. No murmur heard.  No friction rub. No gallop.   Pulmonary:     Effort: Pulmonary effort is normal. No respiratory distress.     Breath sounds: Normal breath sounds.  Abdominal:     General: Bowel sounds are normal. There is no distension.     Palpations: Abdomen is soft. There is no mass.     Tenderness: There is no abdominal tenderness. There is no guarding.  Genitourinary:    Comments: No cva tenderness.  Musculoskeletal:        General: No swelling or tenderness.     Cervical back: Normal range of motion and neck supple. No rigidity. No muscular tenderness.     Right lower leg: No edema.     Left lower leg: No edema.  Skin:    General: Skin is warm and dry.     Findings: No rash.  Neurological:     Mental Status: She is alert.     Comments: Alert, speech normal. Motor intact bil, stre 5/5. No pronator drift. Sensation grossly intact bil. Steady gait.   Psychiatric:        Mood and Affect: Mood normal.     ED Results / Procedures / Treatments   Labs (all labs ordered are listed,  but only abnormal results are displayed) Results for orders placed or performed during the hospital encounter of 10/02/19  Basic metabolic panel  Result Value Ref Range   Sodium 135 135 - 145 mmol/L   Potassium 2.9 (L) 3.5 - 5.1 mmol/L   Chloride 99 98 - 111 mmol/L   CO2 23 22 - 32 mmol/L   Glucose, Bld 142 (H) 70 - 99 mg/dL   BUN 11 6 - 20 mg/dL  Creatinine, Ser 1.02 (H) 0.44 - 1.00 mg/dL   Calcium 9.6 8.9 - 29.9 mg/dL   GFR calc non Af Amer >60 >60 mL/min   GFR calc Af Amer >60 >60 mL/min   Anion gap 13 5 - 15  CBC  Result Value Ref Range   WBC 9.4 4.0 - 10.5 K/uL   RBC 4.64 3.87 - 5.11 MIL/uL   Hemoglobin 12.5 12.0 - 15.0 g/dL   HCT 24.2 36 - 46 %   MCV 89.0 80.0 - 100.0 fL   MCH 26.9 26.0 - 34.0 pg   MCHC 30.3 30.0 - 36.0 g/dL   RDW 68.3 41.9 - 62.2 %   Platelets 196 150 - 400 K/uL   nRBC 0.0 0.0 - 0.2 %  Urinalysis, Routine w reflex microscopic  Result Value Ref Range   Color, Urine STRAW (A) YELLOW   APPearance HAZY (A) CLEAR   Specific Gravity, Urine 1.006 1.005 - 1.030   pH 6.0 5.0 - 8.0   Glucose, UA NEGATIVE NEGATIVE mg/dL   Hgb urine dipstick SMALL (A) NEGATIVE   Bilirubin Urine NEGATIVE NEGATIVE   Ketones, ur NEGATIVE NEGATIVE mg/dL   Protein, ur NEGATIVE NEGATIVE mg/dL   Nitrite NEGATIVE NEGATIVE   Leukocytes,Ua TRACE (A) NEGATIVE   RBC / HPF 0-5 0 - 5 RBC/hpf   WBC, UA 0-5 0 - 5 WBC/hpf   Bacteria, UA RARE (A) NONE SEEN   Squamous Epithelial / LPF 0-5 0 - 5  I-Stat beta hCG blood, ED  Result Value Ref Range   I-stat hCG, quantitative <5.0 <5 mIU/mL   Comment 3           EKG EKG Interpretation  Date/Time:  Thursday October 02 2019 23:38:22 EDT Ventricular Rate:  86 PR Interval:  148 QRS Duration: 80 QT Interval:  372 QTC Calculation: 445 R Axis:   7 Text Interpretation: Normal sinus rhythm Normal ECG Confirmed by Cathren Laine ([id number]) on 10/03/2019 12:37:48 PM   Radiology No results found.  Procedures Procedures (including critical care  time)  Medications Ordered in ED Medications  sodium chloride flush (NS) 0.9 % injection 3 mL (has no administration in time range)    ED Course  I have reviewed the triage vital signs and the nursing notes.  Pertinent labs & imaging results that were available during my care of the patient were reviewed by me and considered in my medical decision making (see chart for details).    MDM Rules/Calculators/A&P                          Labs sent. Ecg. Cardiac monitoring.   EMS rhythm strips reviewed, normal sinus rhythm, ems ecg without acute st or t changes.   Reviewed nursing notes and prior charts for additional history.   Labs reviewed/interpreted by me - k low 2.9. other lytes/chem normal. Hgb/wbc normal. ua neg for infection. KCL po.   Patient denies any recurrent faintness or dizziness in 12+ hours in ED. On cardiac monitor, remains in nsr, no dysrhythmia noted.   Pt asking for food/drink - provided.   Pt remains asymptomatic, and appears stable for d/c.   Rec pcp/card f/u, possible outpt monitor.   Return precautions provided.    Final Clinical Impression(s) / ED Diagnoses Final diagnoses:  None    Rx / DC Orders ED Discharge Orders    None       Cathren Laine, MD 10/03/19 1317

## 2019-10-06 ENCOUNTER — Encounter: Payer: Self-pay | Admitting: Family Medicine

## 2019-10-06 ENCOUNTER — Ambulatory Visit: Payer: BC Managed Care – PPO | Admitting: Family Medicine

## 2019-10-06 ENCOUNTER — Other Ambulatory Visit: Payer: Self-pay

## 2019-10-06 VITALS — BP 122/80 | HR 60 | Temp 97.9°F | Wt 164.5 lb

## 2019-10-06 DIAGNOSIS — E119 Type 2 diabetes mellitus without complications: Secondary | ICD-10-CM

## 2019-10-06 DIAGNOSIS — R55 Syncope and collapse: Secondary | ICD-10-CM

## 2019-10-06 DIAGNOSIS — I10 Essential (primary) hypertension: Secondary | ICD-10-CM | POA: Diagnosis not present

## 2019-10-06 DIAGNOSIS — E1159 Type 2 diabetes mellitus with other circulatory complications: Secondary | ICD-10-CM

## 2019-10-06 MED ORDER — LISINOPRIL 20 MG PO TABS: 20.0000 mg | ORAL_TABLET | Freq: Every day | ORAL | 3 refills | Status: DC

## 2019-10-06 MED ORDER — METFORMIN HCL 500 MG PO TABS: 500.0000 mg | ORAL_TABLET | Freq: Every day | ORAL | 3 refills | Status: DC

## 2019-10-06 NOTE — Assessment & Plan Note (Signed)
BP normal. Had hypoK likely 2/2 to HCTZ. Advised stopping and stopping K supplement. Prescription for lisinopril 20 mg alone to take and do home monitoring. Return 1 week for labs to check K.

## 2019-10-06 NOTE — Patient Instructions (Signed)
#   Low potassium - Stop your current blood pressure medication - Start Lisinopril 20 mg daily - Monitor blood pressure  - if blood pressure >140/90 -- mychart or call to update - Return next week for labs  #Fainting - referral to cardiology  #Referral I have placed a referral to a specialist for you. You should receive a phone call from the specialty office. Make sure your voicemail is not full and that if you are able to answer your phone to unknown or new numbers.   It may take up to 2 weeks to hear about the referral. If you do not hear anything in 2 weeks, please call our office and ask to speak with the referral coordinator.

## 2019-10-06 NOTE — Progress Notes (Signed)
Subjective:     Andrea Rivas is a 59 y.o. female presenting for Follow-up (from ER visit for syncope )     HPI   #Syncope - will feel tired if she stands for too long - got symptoms in the grocery store - water intake - doing better, 2 bottles a day 16 oz - diet sodas - 2 a day - caffeine free and sugar free soda - fainted x 3 prior to ER visit - endorses occassional heart palpations - but not related to presyncopal symptoms  Day of episode- had pizza and soda - fainting occurred when sitting, tried to get up but couldn't get up  #HTN - does not regularly check blood pressure - has also been taking her potassium tablets.    Review of Systems   Social History   Tobacco Use  Smoking Status Current Every Day Smoker  . Packs/day: 0.50  . Years: 30.00  . Pack years: 15.00  . Types: Cigarettes  Smokeless Tobacco Former Neurosurgeon  . Types: Snuff, Chew  . Quit date: 03/13/1980        Objective:    BP Readings from Last 3 Encounters:  10/06/19 122/80  10/03/19 126/77  05/20/19 138/82   Wt Readings from Last 3 Encounters:  10/06/19 164 lb 8 oz (74.6 kg)  10/02/19 163 lb 2.3 oz (74 kg)  05/20/19 163 lb 4 oz (74 kg)    BP 122/80   Pulse 60   Temp 97.9 F (36.6 C) (Temporal)   Wt 164 lb 8 oz (74.6 kg)   SpO2 98%   BMI 29.14 kg/m    Physical Exam Constitutional:      General: She is not in acute distress.    Appearance: She is well-developed. She is not diaphoretic.  HENT:     Head: Normocephalic and atraumatic.     Right Ear: External ear normal.     Left Ear: External ear normal.  Eyes:     Conjunctiva/sclera: Conjunctivae normal.  Neck:     Vascular: No carotid bruit.  Cardiovascular:     Rate and Rhythm: Normal rate and regular rhythm.     Heart sounds: No murmur heard.   Pulmonary:     Effort: Pulmonary effort is normal. No respiratory distress.     Breath sounds: Normal breath sounds. No wheezing.  Musculoskeletal:     Cervical back: Normal  range of motion and neck supple.  Skin:    General: Skin is warm and dry.     Capillary Refill: Capillary refill takes less than 2 seconds.  Neurological:     Mental Status: She is alert. Mental status is at baseline.  Psychiatric:        Mood and Affect: Mood normal.        Behavior: Behavior normal.           Assessment & Plan:   Problem List Items Addressed This Visit      Cardiovascular and Mediastinum   Hypertension associated with diabetes (HCC) - Primary    BP normal. Had hypoK likely 2/2 to HCTZ. Advised stopping and stopping K supplement. Prescription for lisinopril 20 mg alone to take and do home monitoring. Return 1 week for labs to check K.       Relevant Medications   lisinopril (ZESTRIL) 20 MG tablet   metFORMIN (GLUCOPHAGE) 500 MG tablet   Other Relevant Orders   Basic metabolic panel   Syncope    Etiology unclear - not consistent  with orthostatic symptoms. BP today is low normal so concerning for possible arrhythmia or bradycardia given symptoms with both sitting and standing position. Seizure less likely given witness/short duration/ and no memory changes or abnormal behavior - though discussed letting me know if changes. No bruit on exam. Referral to cardiology for consideration of monitoring and additional work-up. If negative may consider head imaging.       Relevant Medications   lisinopril (ZESTRIL) 20 MG tablet   Other Relevant Orders   Ambulatory referral to Cardiology     Endocrine   Diabetes [facility])    Diet controlled, but she does take metformin occasionally if her glucose runs high. Refill provided.       Relevant Medications   lisinopril (ZESTRIL) 20 MG tablet   metFORMIN (GLUCOPHAGE) 500 MG tablet       Return for after cardiology.  Lynnda Child, MD  This visit occurred during the SARS-CoV-2 public health emergency.  Safety protocols were in place, including screening questions prior to the visit, additional usage of staff PPE, and  extensive cleaning of exam room while observing appropriate contact time as indicated for disinfecting solutions.

## 2019-10-06 NOTE — Assessment & Plan Note (Signed)
Diet controlled, but she does take metformin occasionally if her glucose runs high. Refill provided.

## 2019-10-06 NOTE — Assessment & Plan Note (Signed)
Etiology unclear - not consistent with orthostatic symptoms. BP today is low normal so concerning for possible arrhythmia or bradycardia given symptoms with both sitting and standing position. Seizure less likely given witness/short duration/ and no memory changes or abnormal behavior - though discussed letting me know if changes. No bruit on exam. Referral to cardiology for consideration of monitoring and additional work-up. If negative may consider head imaging.

## 2019-10-07 NOTE — Progress Notes (Signed)
Cardiology Office Note:   Date:  10/08/2019  NAME:  Andrea Rivas    MRN: [id number] DOB:  11-25-1960   PCP:  Lynnda Childody, Jessica R, MD  Cardiologist:  No primary care provider on file.  Electrophysiologist:  None   Referring MD: Lynnda Childody, Jessica R, MD   Chief Complaint  Patient presents with  . Loss of Consciousness    History of Present Illness:   Andrea Rivas is a 59 y.o. female with a hx of DM (A1c 5.6), HTN who is being seen today for the evaluation of syncope at the request of Lynnda Childody, Jessica R, MD. Recent evaluation in the ER 10/02/2019 after syncope. Work-up in ER negative.  She reports last Thursday around 10:30 PM she had a syncopal event.  She apparently was sitting around and talking to family members and then she blacked out.  She reports that she became hot and sweaty prior to the event and then had blurry vision.  She apparently was sitting.  She reports she had had around 3 alcoholic beverages.  She had not eaten or had much to drink in terms of water that day.  She reports no chest pain or shortness of breath prior to the event.  She reports that she came to within 5 minutes.  She was back to her normal self very quickly.  She denies any defecation or incontinence around the timing of the episode.  In the emergency room her work-up was negative.  Her medical history is significant for diabetes.  She is not taking Metformin most recent A1c is 5.6.  No recent medication changes.  She remains on lisinopril and Lipitor.  She reports he has no limitations with her current level of activity.  This includes walking in around town.  She has no chest pain or shortness of breath.  She is never had any heart issues.  Her EKG today is normal sinus rhythm with no acute ST-T changes or evidence of prior infarction.  She has no murmurs on examination.  She is a current everyday smoker for about 15 years.  She reports a family history of heart disease in her mother.  She has never had a heart attack or  stroke.   Problem List 1. DM -A1c 5.6 -T chol 211, HDL 74, LDL 102, TG 173   Past Medical History: Past Medical History:  Diagnosis Date  . Benign colon polyp   . Diabetes mellitus without complication (HCC)   . Diverticulitis   . History of chicken pox   . Hyperlipidemia   . Hypertension     Past Surgical History: Past Surgical History:  Procedure Laterality Date  . ABDOMINAL HYSTERECTOMY     still has both ovaries, cervix removed  . BREAST REDUCTION SURGERY Bilateral   . CESAREAN SECTION  1982    Current Medications: Current Meds  Medication Sig  . atorvastatin (LIPITOR) 80 MG tablet Take 1 tablet (80 mg total) by mouth daily.  Marland Kitchen. lisinopril (ZESTRIL) 20 MG tablet Take 1 tablet (20 mg total) by mouth daily.  . Multiple Vitamins-Minerals (CENTRUM SILVER PO) Take 1 tablet by mouth daily.     Allergies:    Tramadol   Social History: Social History   Socioeconomic History  . Marital status: Widowed    Spouse name: Not on file  . Number of children: 2  . Years of education: High school  . Highest education level: Not on file  Occupational History  . Occupation: retired, TA  Tobacco Use  .  Smoking status: Current Every Day Smoker    Packs/day: 0.50    Years: 15.00    Pack years: 7.50    Types: Cigarettes  . Smokeless tobacco: Former Neurosurgeon    Types: Snuff, Dorna Bloom    Quit date: 03/13/1980  Vaping Use  . Vaping Use: Never used  Substance and Sexual Activity  . Alcohol use: Yes    Comment: weekend, 3, 16 oz beers  . Drug use: Never  . Sexual activity: Not Currently  Other Topics Concern  . 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 Difficulty of Paying Living Expenses:   Food Insecurity:   . Worried About Programme researcher, broadcasting/film/video in the Last Year:   . Barista in the Last Year:   Transportation Needs:   . Freight forwarder (Medical):   Marland Kitchen Lack of Transportation (Non-Medical):   Physical Activity:   . Days of Exercise per Week:   . Minutes of Exercise per Session:   Stress:   . Feeling of Stress :   Social Connections:   . Frequency of Communication with Friends and Family:   . Frequency of Social Gatherings with Friends and Family:   . Attends Religious Services:   . Active Member of Clubs or Organizations:   . Attends Banker Meetings:   Marland Kitchen Marital Status:      Family History: The patient's family history includes Alcohol abuse in her brother and brother; Diabetes in her brother, mother, and sister; Drug abuse in her brother and brother; Heart disease in her mother; Hyperlipidemia in her brother, mother, and sister; Hypertension in her mother; Kidney disease in her brother; Kidney failure in her mother; Prostate cancer in her father; Rectal cancer (age of onset: 67) in her mother.  ROS:   All other ROS reviewed and negative. Pertinent positives noted in the HPI.     EKGs/Labs/Other Studies Reviewed:   The following studies were personally reviewed by me today:  EKG:  EKG is  ordered today.  The ekg ordered today demonstrates normal sinus rhythm, heart rate 65, no acute ST-T changes, no evidence of infarction, and was personally reviewed by me.   Recent Labs: 05/20/2019: ALT 17 10/02/2019: BUN 11; Creatinine, Ser 1.02; Hemoglobin 12.5; Platelets 196; Potassium 2.9; Sodium 135   Recent Lipid Panel    Component Value Date/Time   CHOL 211 (H) 05/20/2019 0902   TRIG 173.0 (H) 05/20/2019 0902   HDL 74.50 05/20/2019 0902   CHOLHDL 3 05/20/2019 0902   VLDL 34.6 05/20/2019 0902   LDLCALC 102 (H) 05/20/2019 0902    Physical Exam:   VS:  BP 118/68   Pulse  65   Ht 5\' 3"  (1.6 m)   Wt 164 lb 12.8 oz (74.8 kg)   SpO2 100%   BMI 29.19 kg/m    Wt Readings from Last 3 Encounters:  10/08/19 164 lb 12.8 oz (74.8 kg)  10/06/19 164 lb 8 oz (74.6 kg)  10/02/19 163 lb 2.3 oz (74 kg)    General: Well  nourished, well developed, in no acute distress Heart: Atraumatic, normal size  Eyes: PEERLA, EOMI  Neck: Supple, no JVD Endocrine: No thryomegaly Cardiac: Normal S1, S2; RRR; no murmurs, rubs, or gallops Lungs: Clear to auscultation bilaterally, no wheezing, rhonchi or rales  Abd: Soft, nontender, no hepatomegaly  Ext: No edema, pulses 2+ Musculoskeletal: No deformities, BUE and BLE strength normal and equal Skin: Warm and dry, no rashes   Neuro: Alert and oriented to person, place, time, and situation, CNII-XII grossly intact, no focal deficits  Psych: Normal mood and affect   ASSESSMENT:   Andrea Rivas is a 59 y.o. female who presents for the following: 1. Syncope, unspecified syncope type   2. Hypertension associated with diabetes (HCC)   3. Hyperlipidemia, unspecified hyperlipidemia type     PLAN:   1. Syncope, unspecified syncope type -She clearly had a vasovagal event per her history.  She also was drinking alcohol and noted to have low potassium in the emergency room.  I suspect she was dehydrated.  She describes no sinister symptoms of chest pain or shortness of breath prior to the event.  No palpitations reported either.  Her EKG today shows normal sinus rhythm with no acute ST-T changes.  Her cardiovascular exam is without murmurs rubs or gallops.  We will proceed with an echocardiogram but I think this will be normal.  I see no need for any further testing such as a stress test.  I would like for her to remain well-hydrated and to continue her current medications.  I advised her to quit Metformin as her A1c is 5.6.  She does not need to have any medications on board that could lower her blood sugar as this could certainly explain her  symptoms if she took that medication.  We will see her on as-needed basis and I will follow-up the results of her ultrasound by phone.  2. Hypertension associated with diabetes (HCC) -Well-controlled on lisinopril.  3. Hyperlipidemia, unspecified hyperlipidemia type -Continue Lipitor.   Disposition: Return if symptoms worsen or fail to improve.  Medication Adjustments/Labs and Tests Ordered: Current medicines are reviewed at length with the patient today.  Concerns regarding medicines are outlined above.  Orders Placed This Encounter  Procedures  . ECHOCARDIOGRAM COMPLETE   No orders of the defined types were placed in this encounter.   Patient Instructions  Medication Instructions:  The current medical regimen is effective;  continue present plan and medications.  *If you need a refill on your cardiac medications before your next appointment, please call your pharmacy*   Testing/Procedures: Echocardiogram - Your physician has requested that you have an echocardiogram. Echocardiography is a painless test that uses sound waves to create images of your heart. It provides your doctor with information about the size and shape of your heart and how well your heart's chambers and valves are working. This procedure takes approximately one hour. There are no restrictions for this procedure. This will be performed at our [facility] location - [address].    Follow-Up: At [facility], you and your health needs are our priority.  As part of our continuing mission to provide you with exceptional heart care, we have created designated Provider Care Teams.  These Care Teams include your primary Cardiologist (physician) and Advanced Practice Providers (APPs -  Physician Assistants and Nurse Practitioners) who all work together to provide you with the care you need, when you need it.  We recommend signing up for the  patient portal called "MyChart".  Sign up information is  provided on this After Visit Summary.  MyChart is used to connect with patients for Virtual Visits (Telemedicine).  Patients are able to view lab/test results, encounter notes, upcoming appointments, etc.  Non-urgent messages can be sent to your provider as well.   To learn more about what you can do with MyChart, go to [email or web address].    Your next appointment:   As needed  The format for your next appointment:   In Person  Provider:   Lennie Odor, MD          Signed, Lenna Gilford. Flora Lipps, MD [facility]  [address], [address] [place], [place] [address] [phone]  10/08/2019 4:02 PM

## 2019-10-08 ENCOUNTER — Other Ambulatory Visit: Payer: Self-pay

## 2019-10-08 ENCOUNTER — Encounter: Payer: Self-pay | Admitting: Cardiovascular Disease

## 2019-10-08 ENCOUNTER — Ambulatory Visit: Payer: BC Managed Care – PPO | Admitting: Cardiovascular Disease

## 2019-10-08 VITALS — BP 118/68 | HR 65 | Ht 63.0 in | Wt 164.8 lb

## 2019-10-08 DIAGNOSIS — R55 Syncope and collapse: Secondary | ICD-10-CM | POA: Diagnosis not present

## 2019-10-08 DIAGNOSIS — E1159 Type 2 diabetes mellitus with other circulatory complications: Secondary | ICD-10-CM

## 2019-10-08 DIAGNOSIS — E785 Hyperlipidemia, unspecified: Secondary | ICD-10-CM | POA: Diagnosis not present

## 2019-10-08 DIAGNOSIS — I152 Hypertension secondary to endocrine disorders: Secondary | ICD-10-CM

## 2019-10-08 DIAGNOSIS — I10 Essential (primary) hypertension: Secondary | ICD-10-CM | POA: Diagnosis not present

## 2019-10-08 NOTE — Patient Instructions (Signed)
Medication Instructions:  The current medical regimen is effective;  continue present plan and medications.  *If you need a refill on your cardiac medications before your next appointment, please call your pharmacy*   Testing/Procedures: Echocardiogram - Your physician has requested that you have an echocardiogram. Echocardiography is a painless test that uses sound waves to create images of your heart. It provides your doctor with information about the size and shape of your heart and how well your heart's chambers and valves are working. This procedure takes approximately one hour. There are no restrictions for this procedure. This will be performed at our [address] location - [address], [address].    Follow-Up: At [facility], you and your health needs are our priority.  As part of our continuing mission to provide you with exceptional heart care, we have created designated Provider Care Teams.  These Care Teams include your primary Cardiologist (physician) and Advanced Practice Providers (APPs -  Physician Assistants and Nurse Practitioners) who all work together to provide you with the care you need, when you need it.  We recommend signing up for the patient portal called "MyChart".  Sign up information is provided on this After Visit Summary.  MyChart is used to connect with patients for Virtual Visits (Telemedicine).  Patients are able to view lab/test results, encounter notes, upcoming appointments, etc.  Non-urgent messages can be sent to your provider as well.   To learn more about what you can do with MyChart, go to [email or web address].    Your next appointment:   As needed  The format for your next appointment:   In Person  Provider:   Gaines O'Neal, MD      

## 2019-10-13 ENCOUNTER — Other Ambulatory Visit: Payer: Self-pay

## 2019-10-13 ENCOUNTER — Other Ambulatory Visit (INDEPENDENT_AMBULATORY_CARE_PROVIDER_SITE_OTHER): Payer: BC Managed Care – PPO

## 2019-10-13 DIAGNOSIS — E1159 Type 2 diabetes mellitus with other circulatory complications: Secondary | ICD-10-CM | POA: Diagnosis not present

## 2019-10-13 DIAGNOSIS — I10 Essential (primary) hypertension: Secondary | ICD-10-CM | POA: Diagnosis not present

## 2019-10-13 LAB — BASIC METABOLIC PANEL
BUN: 12 mg/dL (ref 6–23)
CO2: 26 mEq/L (ref 19–32)
Calcium: 9.6 mg/dL (ref 8.4–10.5)
Chloride: 106 mEq/L (ref 96–112)
Creatinine, Ser: 0.9 mg/dL (ref 0.40–1.20)
GFR: 77.73 mL/min (ref >60.00)
Glucose, Bld: 110 mg/dL — ABNORMAL HIGH (ref 70–99)
Potassium: 3.8 mEq/L (ref 3.5–5.1)
Sodium: 140 mEq/L (ref 135–145)

## 2019-10-20 NOTE — Addendum Note (Signed)
Addended by: Brunetta Genera on: 10/20/2019 11:22 AM   Modules accepted: Orders

## 2019-10-25 ENCOUNTER — Other Ambulatory Visit: Payer: Self-pay | Admitting: Family Medicine

## 2019-10-25 DIAGNOSIS — E785 Hyperlipidemia, unspecified: Secondary | ICD-10-CM

## 2019-10-27 ENCOUNTER — Other Ambulatory Visit: Payer: Self-pay

## 2019-10-27 ENCOUNTER — Ambulatory Visit (HOSPITAL_COMMUNITY): Payer: BC Managed Care – PPO | Attending: Internal Medicine

## 2019-10-27 DIAGNOSIS — R55 Syncope and collapse: Secondary | ICD-10-CM | POA: Insufficient documentation

## 2019-10-27 LAB — ECHOCARDIOGRAM COMPLETE
Area-P 1/2: 4.68 cm2
S' Lateral: 2.8 cm

## 2020-01-16 ENCOUNTER — Ambulatory Visit: Payer: BC Managed Care – PPO | Admitting: Family Medicine

## 2020-01-16 ENCOUNTER — Encounter: Payer: Self-pay | Admitting: Family Medicine

## 2020-01-16 ENCOUNTER — Other Ambulatory Visit: Payer: Self-pay

## 2020-01-16 VITALS — BP 140/90 | HR 74 | Temp 97.2°F | Ht 62.99 in | Wt 170.5 lb

## 2020-01-16 DIAGNOSIS — S39012A Strain of muscle, fascia and tendon of lower back, initial encounter: Secondary | ICD-10-CM | POA: Diagnosis not present

## 2020-01-16 DIAGNOSIS — R3915 Urgency of urination: Secondary | ICD-10-CM | POA: Diagnosis not present

## 2020-01-16 LAB — POCT UA - MICROSCOPIC ONLY

## 2020-01-16 LAB — POCT URINALYSIS DIPSTICK
Bilirubin, UA: NEGATIVE
Glucose, UA: NEGATIVE
Ketones, UA: NEGATIVE
Leukocytes, UA: NEGATIVE
Nitrite, UA: NEGATIVE
Protein, UA: NEGATIVE
Spec Grav, UA: >=1.03 — AB (ref 1.010–1.025)
Urobilinogen, UA: 0.2 E.U./dL
pH, UA: 6 (ref 5.0–8.0)

## 2020-01-16 NOTE — Assessment & Plan Note (Signed)
UA and symptoms not consistent with UTI.  May have bladder irritaion for diet... avoid irritants.Marland Kitchen stop Snapple given sugar/lemonade  or from increase glucose... work on low carb diet.  Increase water intake as urine concentrated.

## 2020-01-16 NOTE — Progress Notes (Signed)
Chief Complaint  Patient presents with   Back Pain    Pt c/o lower back pain. x32month   Urinary Urgency    x3month    History of Present Illness: HPI  59 year old female  Pt of Dr. Selena Batten with history for HTN, diabetes, IBS presents with  1 month of low back pain and urinary urgency.  She reports she was doing will until 1 month ago.. she had noted tightness in low back, pain with bending forward. Off and on. No fall, no change in activity prior.  She has noted  Increase in urine frequency during the day.   No dysuria. No abd pain.  Wt Readings from Last 3 Encounters:  01/16/20 170 lb 8 oz (77.3 kg)  10/08/19 164 lb 12.8 oz (74.8 kg)  10/06/19 164 lb 8 oz (74.6 kg)     FBS 147 Lab Results  Component Value Date   HGBA1C 5.6 05/20/2019      UA in office shows concentrated urine but no S/S of infection. No glucose in urine. There is small amount of blood on UA but  On micro it shows numerous epithelial.. contaminated sample.   This visit occurred during the SARS-CoV-2 public health emergency.  Safety protocols were in place, including screening questions prior to the visit, additional usage of staff PPE, and extensive cleaning of exam room while observing appropriate contact time as indicated for disinfecting solutions.   COVID 19 screen:  No recent travel or known exposure to COVID19 The patient denies respiratory symptoms of COVID 19 at this time. The importance of social distancing was discussed today.     Review of Systems  Constitutional: Negative for chills and fever.  HENT: Negative for congestion and ear pain.   Eyes: Negative for pain and redness.  Respiratory: Negative for cough and shortness of breath.   Cardiovascular: Negative for chest pain, palpitations and leg swelling.  Gastrointestinal: Negative for abdominal pain, blood in stool, constipation, diarrhea, nausea and vomiting.  Genitourinary: Positive for frequency and urgency. Negative for dysuria.   Musculoskeletal: Negative for falls and myalgias.  Skin: Negative for rash.  Neurological: Negative for dizziness.  Psychiatric/Behavioral: Negative for depression. The patient is not nervous/anxious.       Past Medical History:  Diagnosis Date   Benign colon polyp    Diabetes mellitus without complication (HCC)    Diverticulitis    History of chicken pox    Hyperlipidemia    Hypertension     reports that she has been smoking cigarettes. She has a 7.50 pack-year smoking history. She quit smokeless tobacco use about 39 years ago.  Her smokeless tobacco use included snuff and chew. She reports current alcohol use. She reports that she does not use drugs.   Current Outpatient Medications:    atorvastatin (LIPITOR) 80 MG tablet, Take 1 tablet by mouth once daily, Disp: 90 tablet, Rfl: 2   lisinopril (ZESTRIL) 20 MG tablet, Take 1 tablet (20 mg total) by mouth daily., Disp: 90 tablet, Rfl: 3   metFORMIN (GLUCOPHAGE) 500 MG tablet, Take 1 tablet (500 mg total) by mouth daily with breakfast., Disp: 90 tablet, Rfl: 3   Multiple Vitamins-Minerals (CENTRUM SILVER PO), Take 1 tablet by mouth daily., Disp: , Rfl:    Observations/Objective: Blood pressure 140/90, pulse 74, temperature (!) 97.2 F (36.2 C), height 5' 2.99" (1.6 m), weight 170 lb 8 oz (77.3 kg), SpO2 98 %.  Physical Exam Constitutional:      General: She is  not in acute distress.    Appearance: Normal appearance. She is well-developed. She is not ill-appearing or toxic-appearing.  HENT:     Head: Normocephalic.     Right Ear: Hearing, tympanic membrane, ear canal and external ear normal. Tympanic membrane is not erythematous, retracted or bulging.     Left Ear: Hearing, tympanic membrane, ear canal and external ear normal. Tympanic membrane is not erythematous, retracted or bulging.     Nose: No mucosal edema or rhinorrhea.     Right Sinus: No maxillary sinus tenderness or frontal sinus tenderness.     Left Sinus:  No maxillary sinus tenderness or frontal sinus tenderness.     Mouth/Throat:     Pharynx: Uvula midline.  Eyes:     General: Lids are normal. Lids are everted, no foreign bodies appreciated.     Conjunctiva/sclera: Conjunctivae normal.     Pupils: Pupils are equal, round, and reactive to light.  Neck:     Thyroid: No thyroid mass or thyromegaly.     Vascular: No carotid bruit.     Trachea: Trachea normal.  Cardiovascular:     Rate and Rhythm: Normal rate and regular rhythm.     Pulses: Normal pulses.     Heart sounds: Normal heart sounds, S1 normal and S2 normal. No murmur heard.  No friction rub. No gallop.   Pulmonary:     Effort: Pulmonary effort is normal. No tachypnea or respiratory distress.     Breath sounds: Normal breath sounds. No decreased breath sounds, wheezing, rhonchi or rales.  Abdominal:     General: Bowel sounds are normal.     Palpations: Abdomen is soft.     Tenderness: There is no abdominal tenderness.  Musculoskeletal:     Cervical back: Normal range of motion and neck supple.     Lumbar back: Tenderness present. No spasms or bony tenderness. Normal range of motion. Negative right straight leg raise test and negative left straight leg raise test.     Comments:  ttp bilateral paraspinous muscles  Skin:    General: Skin is warm and dry.     Findings: No rash.  Neurological:     Mental Status: She is alert.  Psychiatric:        Mood and Affect: Mood is not anxious or depressed.        Speech: Speech normal.        Behavior: Behavior normal. Behavior is cooperative.        Thought Content: Thought content normal.        Judgment: Judgment normal.      Assessment and Plan Urinary urgency UA and symptoms not consistent with UTI.  May have bladder irritaion for diet... avoid irritants.Marland Kitchen stop Snapple given sugar/lemonade  or from increase glucose... work on low carb diet.  Increase water intake as urine concentrated.  Strain of lumbar region treate with  NSADIs, heat and home PT.        Kerby Nora, MD

## 2020-01-16 NOTE — Patient Instructions (Signed)
Work on low [employer], exercsie and weight loss.  No clear urinary infection. Start home physical therapy and low back stretching, heat and ibuprofen 800 mg three times daily as needed for low back pain.

## 2020-01-16 NOTE — Assessment & Plan Note (Signed)
treate with NSADIs, heat and home PT.

## 2020-06-04 ENCOUNTER — Other Ambulatory Visit: Payer: Self-pay | Admitting: Family Medicine

## 2020-06-04 DIAGNOSIS — E785 Hyperlipidemia, unspecified: Secondary | ICD-10-CM

## 2020-09-28 ENCOUNTER — Other Ambulatory Visit: Payer: Self-pay | Admitting: Family Medicine

## 2020-09-28 DIAGNOSIS — I152 Hypertension secondary to endocrine disorders: Secondary | ICD-10-CM

## 2020-10-07 ENCOUNTER — Encounter: Payer: Self-pay | Admitting: Family Medicine

## 2020-10-07 ENCOUNTER — Telehealth (INDEPENDENT_AMBULATORY_CARE_PROVIDER_SITE_OTHER): Payer: Self-pay | Admitting: Family Medicine

## 2020-10-07 ENCOUNTER — Other Ambulatory Visit: Payer: Self-pay

## 2020-10-07 VITALS — Wt 162.0 lb

## 2020-10-07 DIAGNOSIS — E1159 Type 2 diabetes mellitus with other circulatory complications: Secondary | ICD-10-CM

## 2020-10-07 DIAGNOSIS — R053 Chronic cough: Secondary | ICD-10-CM

## 2020-10-07 DIAGNOSIS — I152 Hypertension secondary to endocrine disorders: Secondary | ICD-10-CM

## 2020-10-07 DIAGNOSIS — F17200 Nicotine dependence, unspecified, uncomplicated: Secondary | ICD-10-CM

## 2020-10-07 DIAGNOSIS — E1169 Type 2 diabetes mellitus with other specified complication: Secondary | ICD-10-CM

## 2020-10-07 DIAGNOSIS — E785 Hyperlipidemia, unspecified: Secondary | ICD-10-CM

## 2020-10-07 MED ORDER — ATORVASTATIN CALCIUM 80 MG PO TABS: 80.0000 mg | ORAL_TABLET | Freq: Every day | ORAL | 0 refills | Status: DC

## 2020-10-07 MED ORDER — PREDNISONE 20 MG PO TABS: 40.0000 mg | ORAL_TABLET | Freq: Every day | ORAL | 0 refills | Status: AC

## 2020-10-07 NOTE — Assessment & Plan Note (Addendum)
C/b HTN and HLD. Overdue for labs. Pt will schedule lab appointment. CBG running 130-145 - discussed need for hgba1c and if <7 reasonable to continue metformin as needed. She will return for annual and labs

## 2020-10-07 NOTE — Progress Notes (Signed)
I connected with Tekila Caillouet on 10/07/20 at  3:40 PM EDT by video and verified that I am speaking with the correct person using two identifiers.   I discussed the limitations, risks, security and privacy concerns of performing an evaluation and management service by video and the availability of in person appointments. I also discussed with the patient that there may be a patient responsible charge related to this service. The patient expressed understanding and agreed to proceed.  Patient location: Home Provider Location: [facility] Participants: Lynnda Child and Rosey Bath Shore[place]   Subjective:     Andrea Rivas is a 60 y.o. female presenting for Cough (X 1 month productive ) and Medication Refill (Atorvastatin..... lipid panel last done march of 2021)     Cough This is a new problem. The current episode started more than 1 month ago. The cough is Productive of sputum. Associated symptoms include shortness of breath (occasionally). Pertinent negatives include no chest pain, chills, fever, sore throat or wheezing.  Medication Refill Associated symptoms include coughing. Pertinent negatives include no chest pain, chills, congestion, fever or sore throat.    Worse with heat, and laying   #HTN - still taking lisinopril 20 mg - not checking bp at home - no cp, vision changes - does occasionally get headaches  #Diabetes - taking metformin as needed - if cbg >191 - checking cbg today was 174 - fasting cbg 137-145 - diabetic diet  Tobacco use - 1/2 ppd for 15 years   Review of Systems  Constitutional:  Negative for chills and fever.  HENT:  Positive for voice change (hoarse). Negative for congestion, sinus pressure, sinus pain and sore throat.   Respiratory:  Positive for cough and shortness of breath (occasionally). Negative for wheezing.   Cardiovascular:  Negative for chest pain.    Social History   Tobacco Use  Smoking Status Every Day   Packs/day:  0.50   Years: 15.00   Pack years: 7.50   Types: Cigarettes  Smokeless Tobacco Former   Types: Snuff, Chew   Quit date: 03/13/1980        Objective:   BP Readings from Last 3 Encounters:  01/16/20 140/90  10/08/19 118/68  10/06/19 122/80   Wt Readings from Last 3 Encounters:  10/07/20 162 lb (73.5 kg)  01/16/20 170 lb 8 oz (77.3 kg)  10/08/19 164 lb 12.8 oz (74.8 kg)    Wt 162 lb (73.5 kg)   BMI 28.70 kg/m   Physical Exam Constitutional:      Appearance: Normal appearance. She is not ill-appearing.  HENT:     Head: Normocephalic and atraumatic.     Right Ear: External ear normal.     Left Ear: External ear normal.  Eyes:     Conjunctiva/sclera: Conjunctivae normal.  Pulmonary:     Effort: Pulmonary effort is normal. No respiratory distress.  Neurological:     Mental Status: She is alert. Mental status is at baseline.  Psychiatric:        Mood and Affect: Mood normal.        Behavior: Behavior normal.        Thought Content: Thought content normal.        Judgment: Judgment normal.           Assessment & Plan:   Problem List Items Addressed This Visit       Cardiovascular and Mediastinum   Hypertension associated with diabetes (HCC) - Primary  Not checking bp at home. Cont lisinopril 20 mg. Discussed if cough not improved will likely switch to losartan. Advised in-person f/u within the next 1-3 months       Relevant Medications   atorvastatin (LIPITOR) 80 MG tablet     Endocrine   Type 2 diabetes mellitus with other specified complication (HCC)    C/b HTN and HLD. Overdue for labs. Pt will schedule lab appointment. CBG running 130-145 - discussed need for hgba1c and if <7 reasonable to continue metformin as needed. She will return for annual and labs       Relevant Medications   atorvastatin (LIPITOR) 80 MG tablet     Other   Hyperlipidemia    Refill atorvastatin. Return for annual and labs       Relevant Medications   atorvastatin  (LIPITOR) 80 MG tablet   Tobacco use disorder   Relevant Medications   predniSONE (DELTASONE) 20 MG tablet   Persistent cough for 3 weeks or longer    Productive in tobacco smoker w/ some sob. Suspect reactive or post viral. Course of steroids. If not improving discussed it could be lisinopril vs allergy vs gerd related.        Relevant Medications   predniSONE (DELTASONE) 20 MG tablet     Return in about 4 weeks (around 11/04/2020) for annual exam and labs.  Lynnda Child, MD

## 2020-10-07 NOTE — Assessment & Plan Note (Signed)
Productive in tobacco smoker w/ some sob. Suspect reactive or post viral. Course of steroids. If not improving discussed it could be lisinopril vs allergy vs gerd related.

## 2020-10-07 NOTE — Assessment & Plan Note (Signed)
Refill atorvastatin. Return for annual and labs

## 2020-10-07 NOTE — Assessment & Plan Note (Signed)
Not checking bp at home. Cont lisinopril 20 mg. Discussed if cough not improved will likely switch to losartan. Advised in-person f/u within the next 1-3 months

## 2020-10-11 ENCOUNTER — Telehealth: Payer: Self-pay | Admitting: Family Medicine

## 2020-12-22 ENCOUNTER — Other Ambulatory Visit: Payer: Self-pay | Admitting: Family Medicine

## 2020-12-22 DIAGNOSIS — E1159 Type 2 diabetes mellitus with other circulatory complications: Secondary | ICD-10-CM

## 2020-12-22 DIAGNOSIS — I152 Hypertension secondary to endocrine disorders: Secondary | ICD-10-CM

## 2020-12-24 NOTE — Telephone Encounter (Signed)
Please schedule follow up with dr cody. Thank you

## 2020-12-27 NOTE — Telephone Encounter (Signed)
Lvm for pt to call to schedule apt and I sent a mychart letter

## 2020-12-28 ENCOUNTER — Telehealth: Payer: Self-pay | Admitting: Family Medicine

## 2020-12-28 DIAGNOSIS — E1159 Type 2 diabetes mellitus with other circulatory complications: Secondary | ICD-10-CM

## 2020-12-28 MED ORDER — LISINOPRIL 20 MG PO TABS: 20.0000 mg | ORAL_TABLET | Freq: Every day | ORAL | 0 refills | Status: DC

## 2020-12-28 NOTE — Telephone Encounter (Signed)
Pt has apt on 10.26.22. pt also  stated she need refill on blood pressure meds she only have 1 left( lisinopril)

## 2020-12-28 NOTE — Telephone Encounter (Signed)
Pt stated that she needs a refill on lisinopril ( blood pressure meds) she only have one left . Pt also have apt now on 10.26.22 with dr. Selena Batten

## 2020-12-28 NOTE — Telephone Encounter (Signed)
Refill provided

## 2021-01-05 ENCOUNTER — Other Ambulatory Visit: Payer: Self-pay

## 2021-01-05 ENCOUNTER — Ambulatory Visit (INDEPENDENT_AMBULATORY_CARE_PROVIDER_SITE_OTHER): Payer: BC Managed Care – PPO

## 2021-01-05 ENCOUNTER — Ambulatory Visit: Payer: BC Managed Care – PPO | Admitting: Family Medicine

## 2021-01-05 VITALS — BP 182/90 | HR 63 | Temp 96.0°F | Ht 63.5 in | Wt 160.4 lb

## 2021-01-05 DIAGNOSIS — Z1231 Encounter for screening mammogram for malignant neoplasm of breast: Secondary | ICD-10-CM

## 2021-01-05 DIAGNOSIS — E1169 Type 2 diabetes mellitus with other specified complication: Secondary | ICD-10-CM | POA: Diagnosis not present

## 2021-01-05 DIAGNOSIS — I152 Hypertension secondary to endocrine disorders: Secondary | ICD-10-CM | POA: Diagnosis not present

## 2021-01-05 DIAGNOSIS — E1159 Type 2 diabetes mellitus with other circulatory complications: Secondary | ICD-10-CM

## 2021-01-05 DIAGNOSIS — R053 Chronic cough: Secondary | ICD-10-CM

## 2021-01-05 DIAGNOSIS — Z1211 Encounter for screening for malignant neoplasm of colon: Secondary | ICD-10-CM

## 2021-01-05 DIAGNOSIS — Z0000 Encounter for general adult medical examination without abnormal findings: Secondary | ICD-10-CM | POA: Diagnosis not present

## 2021-01-05 DIAGNOSIS — E785 Hyperlipidemia, unspecified: Secondary | ICD-10-CM | POA: Diagnosis not present

## 2021-01-05 DIAGNOSIS — Z114 Encounter for screening for human immunodeficiency virus [HIV]: Secondary | ICD-10-CM

## 2021-01-05 DIAGNOSIS — Z23 Encounter for immunization: Secondary | ICD-10-CM

## 2021-01-05 LAB — POCT GLYCOSYLATED HEMOGLOBIN (HGB A1C): Hemoglobin A1C: 5.7 % — AB (ref 4.0–5.6)

## 2021-01-05 LAB — COMPREHENSIVE METABOLIC PANEL
ALT: 11 U/L (ref 0–35)
AST: 15 U/L (ref 0–37)
Albumin: 4.5 g/dL (ref 3.5–5.2)
Alkaline Phosphatase: 82 U/L (ref 39–117)
BUN: 9 mg/dL (ref 6–23)
CO2: 29 mEq/L (ref 19–32)
Calcium: 9.5 mg/dL (ref 8.4–10.5)
Chloride: 106 mEq/L (ref 96–112)
Creatinine, Ser: 0.91 mg/dL (ref 0.40–1.20)
GFR: 69.04 mL/min (ref >60.00)
Glucose, Bld: 112 mg/dL — ABNORMAL HIGH (ref 70–99)
Potassium: 4 mEq/L (ref 3.5–5.1)
Sodium: 142 mEq/L (ref 135–145)
Total Bilirubin: 0.8 mg/dL (ref 0.2–1.2)
Total Protein: 7.1 g/dL (ref 6.0–8.3)

## 2021-01-05 LAB — CBC WITH DIFFERENTIAL/PLATELET
Basophils Absolute: 0 10*3/uL (ref 0.0–0.1)
Basophils Relative: 0.9 % (ref 0.0–3.0)
Eosinophils Absolute: 0.1 10*3/uL (ref 0.0–0.7)
Eosinophils Relative: 2.2 % (ref 0.0–5.0)
HCT: 40.4 % (ref 36.0–46.0)
Hemoglobin: 12.7 g/dL (ref 12.0–15.0)
Lymphocytes Relative: 37 % (ref 12.0–46.0)
Lymphs Abs: 1.8 10*3/uL (ref 0.7–4.0)
MCHC: 31.4 g/dL (ref 30.0–36.0)
MCV: 86.9 fl (ref 78.0–100.0)
Monocytes Absolute: 0.3 10*3/uL (ref 0.1–1.0)
Monocytes Relative: 6.1 % (ref 3.0–12.0)
Neutro Abs: 2.6 10*3/uL (ref 1.4–7.7)
Neutrophils Relative %: 53.8 % (ref 43.0–77.0)
Platelets: 179 10*3/uL (ref 150.0–400.0)
RBC: 4.65 Mil/uL (ref 3.87–5.11)
RDW: 12.6 % (ref 11.5–15.5)
WBC: 4.7 10*3/uL (ref 4.0–10.5)

## 2021-01-05 LAB — LIPID PANEL
Cholesterol: 194 mg/dL (ref 0–200)
HDL: 61.9 mg/dL (ref >39.00)
LDL Cholesterol: 107 mg/dL — ABNORMAL HIGH (ref 0–99)
NonHDL: 132.49
Total CHOL/HDL Ratio: 3
Triglycerides: 129 mg/dL (ref 0.0–149.0)
VLDL: 25.8 mg/dL (ref 0.0–40.0)

## 2021-01-05 LAB — TSH: TSH: 1.15 u[IU]/mL (ref 0.35–5.50)

## 2021-01-05 MED ORDER — ATORVASTATIN CALCIUM 80 MG PO TABS: 80.0000 mg | ORAL_TABLET | Freq: Every day | ORAL | 1 refills | Status: DC

## 2021-01-05 MED ORDER — LOSARTAN POTASSIUM 50 MG PO TABS: 50.0000 mg | ORAL_TABLET | Freq: Every day | ORAL | 1 refills | Status: DC

## 2021-01-05 MED ORDER — AMLODIPINE BESYLATE 5 MG PO TABS: 5.0000 mg | ORAL_TABLET | Freq: Every day | ORAL | 1 refills | Status: DC

## 2021-01-05 NOTE — Assessment & Plan Note (Signed)
BP poorly controlled. Suspect cough 2/2 to lisinopril. Stop lisinopril. Start losartan 50 mg. Add Amlodipine 5 mg. Get home cuff. Advised quitting tobacco. Return 4 months or sooner if BP >150/90 at home.

## 2021-01-05 NOTE — Assessment & Plan Note (Signed)
Lab Results  Component Value Date   HGBA1C 5.7 (A) 01/05/2021   Well controlled. Cont prn metformin

## 2021-01-05 NOTE — Assessment & Plan Note (Signed)
No improvement with prednisone. Suspect this is 2/2 to lisinopril, however, in smoker will get CXR to r/o other causes. Stop lisinopril

## 2021-01-05 NOTE — Patient Instructions (Addendum)
Please call the location of your choice from the menu below to schedule your Mammogram and/or Bone Density appointment.    [facility]   [facility]                      Phone:  [phone] [address]                               [place], [place] [address]                                                             Services: Traditional and 3D Mammogram, Bone Density    Your blood pressure high.   High blood pressure increases your risk for heart attack and stroke.    Please check your blood pressure 2-4 times a week.   To check your blood pressure 1) Sit in a quiet and relaxed place for 5 minutes 2) Make sure your feet are flat on the ground 3) Consider checking first thing in the morning   Normal blood pressure is less than 140/90 Ideally you blood pressure should be around 120/80  **Come back in 2 months if blood pressure >150/95  Other ways you can reduce your blood pressure:  1) Regular exercise -- Try to get 150 minutes (30 minutes, 5 days a week) of moderate to vigorous aerobic excercise -- Examples: brisk walking (2.5 miles per hour), water aerobics, dancing, gardening, tennis, biking slower than 10 miles per hour 2) DASH Diet - low fat meats, more fresh fruits and vegetables, whole grains, low salt 3) Quit smoking if you smoke 4) Loose 5-10% of your body weight  **Try a sound machine instead of TV   Sleep hygiene checklist: 1. Avoid naps during the day 2. Avoid stimulants such as caffeine and nicotine. Avoid bedtime alcohol (it can speed onset of sleep but the body's metabolism can cause awakenings). At least 2 hours before bedtime 3. All forms of exercise help ensure sound sleep - limit vigorous exercise to morning or late afternoon 4. Avoid food too close to bedtime including chocolate (which contains caffeine) 5. Soak up natural light 6. Establish regular bedtime routine. 7. Associate bed with sleep - avoid TV, computer  or phone, reading while in bed. 8. Ensure pleasant, relaxing sleep environment - quiet, dark, cool room.  Good Sleep Hygiene Habits -- Got to bed and wake up within an hour of the same time every day -- Avoid bright screens (from laptop, phone, TV) within at least 30 minutes before bed. The "blue light" supresses the sleep hormone melatonin and the content may stimulate as well -- Maintain a quiet and dark sleep environment (blackout curtains, turn on a fan or white noise to block out disruptive sounds) -- Practicing relaxing activites before bed (taking a shower, reading a book, journaling, meditation app) -- To quiet a busy mind -- consider journaling before bed (jotting down reminders, worry thoughts, as well as positive things like a gratitude list)   Begin a Mindfulness/Meditation practice -- this can take a little as 3 minutes -- You can find resources in books -- Or you can download apps like  ---- Headspace App (which currently has  free content called "Weathering the [employer]") ---- Calm (which has a few free options)  ---- Insignt Timer ---- Stop, Breathe & Think  # With each of these Apps - you should decline the "start free trial" offer and as you search through the App should be able to access some of their free content. You can also chose to pay for the content if you find one that works well for you.   # Many of them also offer sleep specific content which may help with insomnia

## 2021-01-05 NOTE — Progress Notes (Signed)
Annual Exam   Chief Complaint:  Chief Complaint  Rivas presents with   Annual Exam    Only concern is the lingering cough. Requesting eye exam, shingrix shots, colonoscopy and covid booster.  Wants mammogram in [place].     History of Present Illness:  [name] is a 60 y.o. No obstetric history on file. who LMP was No LMP recorded. Rivas has had a hysterectomy., presents today for her annual examination.    #HTN - taking lisinopril 20 mg - was on HCTZ but ended up with dehydration and hospital - no cp, sob, ha  #Cough - took prednisone in the summer w/o improvement - did not f/u due to insurance issue - does cough up some clear mucus - no sinus symptoms - no heartburn  Lab Results  Component Value Date   HGBA1C 5.7 (A) 01/05/2021     Nutrition She does get adequate calcium and Vitamin D in her diet. Diet: tries to be healthy Exercise: 11000 steps at steps - at [place] The Rivas wears seatbelts: yes.     The Rivas feels safe at home and in their relationships: yes.   Menstrual:  Symptoms of menopause: none S/p hysterectomy  GYN She is not sexually active.    Cervical Cancer Screening (21-65):   S/p hystectomy  Breast Cancer Screening (Age 57-74):  There is no FH of breast cancer. There is no FH of ovarian cancer. BRCA screening Not Indicated.  Last Mammogram: unknown The Rivas does want a mammogram this year.    Colon Cancer Screening:  Age 68-75 yo - benefits outweigh the risk. Adults 39-85 yo who have never been screened benefit.  Benefits: 134000 people in 2016 will be diagnosed and 49,000 will die - early detection helps [place]: Complications 2/2 to colonoscopy High Risk (Colonoscopy): genetic disorder (Lynch syndrome or familial adenomatous polyposis), personal hx of IBD, previous adenomatous polyp, or previous colorectal cancer, FamHx start 10 years before the age at diagnosis, increased in males and black  race  Options:  FIT - looks for hemoglobin (blood in the stool) - specific and fairly sensitive - must be done annually Cologuard - looks for DNA and blood - more sensitive - therefore can have more false positives, every 3 years Colonoscopy - every 10 years if normal - sedation, bowl prep, must have someone drive you  Shared decision making and the Rivas had decided to do colonoscopy - believes is overdue.   Social History   Tobacco Use  Smoking Status Every Day   Packs/day: 0.50   Years: 15.00   Pack years: 7.50   Types: Cigarettes  Smokeless Tobacco Former   Types: Snuff, Chew   Quit date: 03/13/1980    Lung Cancer Screening (Ages 38-80): yes 20 year pack history? No Current Tobacco user? Yes Quit less than 15 years ago? No Interested in low dose CT for lung cancer screening? not applicable  Weight Wt Readings from Last 3 Encounters:  01/05/21 160 lb 6 oz (72.7 kg)  10/07/20 162 lb (73.5 kg)  01/16/20 170 lb 8 oz (77.3 kg)   Rivas has high BMI  BMI Readings from Last 1 Encounters:  01/05/21 27.96 kg/m     Chronic disease screening Blood pressure monitoring:  BP Readings from Last 3 Encounters:  01/05/21 (!) 182/90  01/16/20 140/90  10/08/19 118/68    Lipid Monitoring: Indication for screening: age >21, obesity, diabetes, family hx, CV risk factors.  Lipid screening: Yes  Lab Results  Component Value Date   CHOL 211 (H) 05/20/2019   HDL 74.50 05/20/2019   LDLCALC 102 (H) 05/20/2019   TRIG 173.0 (H) 05/20/2019   CHOLHDL 3 05/20/2019     Diabetes Screening: age >42, overweight, family hx, PCOS, hx of gestational diabetes, at risk ethnicity Diabetes Screening screening: Yes  Lab Results  Component Value Date   HGBA1C 5.7 (A) 01/05/2021     Past Medical History:  Diagnosis Date   Benign colon polyp    Diabetes mellitus without complication ([place])    Diverticulitis    History of chicken pox    Hyperlipidemia    Hypertension     Past  Surgical History:  Procedure Laterality Date   ABDOMINAL HYSTERECTOMY     still has both ovaries, cervix removed   BREAST REDUCTION SURGERY Bilateral    CESAREAN SECTION  1982    Prior to Admission medications   Medication Sig Start Date End Date Taking? Authorizing Provider  atorvastatin (LIPITOR) 80 MG tablet Take 1 tablet (80 mg total) by mouth daily. Appt with PCP needed for further refills. 10/07/20  Yes Lesleigh Noe, MD  lisinopril (ZESTRIL) 20 MG tablet Take 1 tablet (20 mg total) by mouth daily. 12/28/20  Yes Lesleigh Noe, MD  metFORMIN (GLUCOPHAGE) 500 MG tablet Take 1 tablet (500 mg total) by mouth daily with breakfast. Rivas taking differently: Take 500 mg by mouth as needed (when BS is elevated). 10/06/19  Yes Lesleigh Noe, MD  Multiple Vitamins-Minerals (CENTRUM SILVER PO) Take 1 tablet by mouth daily.   Yes [provider]    Allergies  Allergen Reactions   Tramadol Itching    Gynecologic History: No LMP recorded. Rivas has had a hysterectomy.  Obstetric History: No obstetric history on file.  Social History   Socioeconomic History   Marital status: Widowed    Spouse name: Not on file   Number of children: 2   Years of education: High school   Highest education level: Not on file  Occupational History   Occupation: retired, TA  Tobacco Use   Smoking status: Every Day    Packs/day: 0.50    Years: 15.00    Pack years: 7.50    Types: Cigarettes   Smokeless tobacco: Former    Types: Snuff, Chew    Quit date: 03/13/1980  Vaping Use   Vaping Use: Never used  Substance and Sexual Activity   Alcohol use: Yes    Comment: weekend, 3, 16 oz beers   Drug use: Never   Sexual activity: Not Currently  Other Topics Concern   Not on file  Social History Narrative   Lives with Daughter - Andrea Rivas and Son Andrea Rivas - good relationship   5 Grandchildren - 3 girls and 2 boys age range 2-17   Enjoys: playing bingo, fishing   Retired - from school -  worked as Educational psychologist for 32 years   Exercise: walking   Diet: eats a large lunch, tries to do low carb diabetic diet   Social Determinants of Radio broadcast assistant Strain: Not on file  Food Insecurity: Not on file  Transportation Needs: Not on file  Physical Activity: Not on file  Stress: Not on file  Social Connections: Not on file  Intimate Partner Violence: Not on file    Family History  Problem Relation Age of Onset   Diabetes Mother    Hyperlipidemia Mother    Hypertension Mother    Kidney failure Mother  Rectal cancer Mother 36   Heart disease Mother    Prostate cancer Father    Diabetes Sister    Hyperlipidemia Sister    Kidney disease Brother    Hyperlipidemia Brother    Drug abuse Brother    Diabetes Brother    Alcohol abuse Brother    Alcohol abuse Brother    Drug abuse Brother     Review of Systems  Constitutional:  Negative for chills and fever.  HENT:  Negative for congestion and sore throat.   Eyes:  Negative for blurred vision and double vision.  Respiratory:  Positive for cough. Negative for shortness of breath.   Cardiovascular:  Negative for chest pain.  Gastrointestinal:  Negative for heartburn, nausea and vomiting.  Genitourinary: Negative.   Musculoskeletal: Negative.  Negative for myalgias.  Skin:  Negative for rash.  Neurological:  Negative for dizziness and headaches.  Endo/Heme/Allergies:  Does not bruise/bleed easily.  Psychiatric/Behavioral:  Negative for depression. The Rivas is not nervous/anxious.     Physical Exam BP (!) 182/90   Pulse 63   Temp (!) 96 F (35.6 C) (Temporal)   Ht 5' 3.5" (1.613 m)   Wt 160 lb 6 oz (72.7 kg)   SpO2 97%   BMI 27.96 kg/m    BP Readings from Last 3 Encounters:  01/05/21 (!) 182/90  01/16/20 140/90  10/08/19 118/68      Physical Exam Constitutional:      General: She is not in acute distress.    Appearance: She is well-developed. She is not diaphoretic.  HENT:     Head: Normocephalic  and atraumatic.     Right Ear: External ear normal.     Left Ear: External ear normal.     Nose: Nose normal.     Mouth/Throat:     Mouth: Mucous membranes are moist.  Eyes:     General: No scleral icterus.    Extraocular Movements: Extraocular movements intact.     Conjunctiva/sclera: Conjunctivae normal.  Cardiovascular:     Rate and Rhythm: Normal rate and regular rhythm.     Heart sounds: No murmur heard. Pulmonary:     Effort: Pulmonary effort is normal. No respiratory distress.     Breath sounds: Normal breath sounds. No wheezing.  Abdominal:     General: Bowel sounds are normal. There is no distension.     Palpations: Abdomen is soft. There is no mass.     Tenderness: There is no abdominal tenderness. There is no guarding or rebound.  Musculoskeletal:        General: Normal range of motion.     Cervical back: Neck supple.  Lymphadenopathy:     Cervical: No cervical adenopathy.  Skin:    General: Skin is warm and dry.     Capillary Refill: Capillary refill takes less than 2 seconds.  Neurological:     Mental Status: She is alert and oriented to person, place, and time.     Deep Tendon Reflexes: Reflexes normal.  Psychiatric:        Mood and Affect: Mood normal.        Behavior: Behavior normal.    Results:  PHQ-9:  [place] Office Visit from 01/05/2021 in [place] at [place]  PHQ-9 Total Score 6         Assessment: 60 y.o. No obstetric history on file. female here for routine annual physical examination.  Plan: Problem List Items Addressed This Visit  Cardiovascular and Mediastinum   Hypertension associated with diabetes ([place])    BP poorly controlled. Suspect cough 2/2 to lisinopril. Stop lisinopril. Start losartan 50 mg. Add Amlodipine 5 mg. Get home cuff. Advised quitting tobacco. Return 4 months or sooner if BP >150/90 at home.       Relevant Medications   atorvastatin (LIPITOR) 80 MG tablet   losartan (COZAAR) 50 MG tablet    amLODipine (NORVASC) 5 MG tablet   Other Relevant Orders   Comprehensive metabolic panel   CBC with Differential   TSH     Endocrine   Type 2 diabetes mellitus with other specified complication [facility])    Lab Results  Component Value Date   HGBA1C 5.7 (A) 01/05/2021  Well controlled. Cont prn metformin      Relevant Medications   atorvastatin (LIPITOR) 80 MG tablet   losartan (COZAAR) 50 MG tablet   Other Relevant Orders   POCT glycosylated hemoglobin (Hb A1C) (Completed)     Other   Hyperlipidemia   Relevant Medications   atorvastatin (LIPITOR) 80 MG tablet   losartan (COZAAR) 50 MG tablet   amLODipine (NORVASC) 5 MG tablet   Other Relevant Orders   Lipid panel   Persistent cough for 3 weeks or longer    No improvement with prednisone. Suspect this is 2/2 to lisinopril, however, in smoker will get CXR to r/o other causes. Stop lisinopril      Relevant Orders   DG Chest 2 View   Other Visit Diagnoses     Annual physical exam    -  Primary   Need for influenza vaccination       Relevant Orders   Flu Vaccine QUAD 19moIM (Fluarix, Fluzone & Alfiuria Quad PF)   Encounter for screening mammogram for malignant neoplasm of breast       Relevant Orders   MM Digital Screening   Colon cancer screening       Relevant Orders   Ambulatory referral to Gastroenterology   Encounter for screening for HIV       Relevant Orders   HIV Antibody (routine testing w rflx)       Screening: -- Blood pressure screen  see plan -- cholesterol screening: will obtain -- Weight screening: overweight: continue to monitor -- Diabetes Screening: will obtain -- Nutrition: Encouraged healthy diet  The 10-year ASCVD risk score (Arnett DK, et al., 2019) is: 43.5%   Values used to calculate the score:     Age: [age]years     Sex: Female     Is Non-Hispanic African American: Yes     Diabetic: Yes     Tobacco smoker: Yes     Systolic Blood Pressure: 1009mmHg     Is BP treated: No     HDL  Cholesterol: 74.5 mg/dL     Total Cholesterol: 211 mg/dL  -- Statin therapy for Age 59-75with CVD risk >7.5%  Psych -- Depression screening (PHQ-9):  [place] [place] Total Score 6        Safety -- tobacco screening:  using, not interested in quitting -- alcohol screening:  low-risk usage. -- no evidence of domestic violence or intimate partner violence.   Cancer Screening -- pap smear not collected per ASCCP guidelines -- family history of breast cancer screening: done. not at high risk. -- Mammogram - ordered -- Colon cancer (age 59+--  referral placed, requesting records  Immunizations Immunization  History  Administered Date(s) Administered   Influenza,inj,Quad PF,6+ Mos 03/25/2018   Moderna Sars-Covid-2 Vaccination 09/30/2019, 10/28/2019   PPD Test 10/30/2016    -- flu vaccine up to date -- TDAP q10 years not up to date - requesting -- Shingles (age >18) - requesting -- PPSV-23 (19-64 with chronic disease or smoking) unknown, record requested -- PCV-13 (age >70) - one dose followed by PPSV-23 1 year later unknown, record requested -- Covid-19 Vaccine up to date   Encouraged healthy diet and exercise. Encouraged regular vision and dental care.    Lesleigh Noe, MD

## 2021-01-06 DIAGNOSIS — Z23 Encounter for immunization: Secondary | ICD-10-CM

## 2021-01-06 LAB — HIV ANTIBODY (ROUTINE TESTING W REFLEX): HIV 1&2 Ab, 4th Generation: NONREACTIVE

## 2021-04-21 ENCOUNTER — Ambulatory Visit
Admission: RE | Admit: 2021-04-21 | Discharge: 2021-04-21 | Disposition: A | Discharge status: Home / Self Care | Payer: BC Managed Care – PPO | Source: Ambulatory Visit | Attending: Family Medicine | Admitting: Family Medicine

## 2021-04-21 ENCOUNTER — Other Ambulatory Visit: Payer: Self-pay

## 2021-04-21 DIAGNOSIS — Z1231 Encounter for screening mammogram for malignant neoplasm of breast: Secondary | ICD-10-CM

## 2021-04-25 ENCOUNTER — Other Ambulatory Visit: Payer: Self-pay | Admitting: Family Medicine

## 2021-04-25 DIAGNOSIS — R928 Other abnormal and inconclusive findings on diagnostic imaging of breast: Secondary | ICD-10-CM

## 2021-05-18 ENCOUNTER — Other Ambulatory Visit: Payer: Self-pay | Admitting: Family Medicine

## 2021-05-18 ENCOUNTER — Ambulatory Visit
Admission: RE | Admit: 2021-05-18 | Discharge: 2021-05-18 | Disposition: A | Discharge status: Home / Self Care | Payer: BC Managed Care – PPO | Source: Ambulatory Visit | Attending: Family Medicine | Admitting: Family Medicine

## 2021-05-18 DIAGNOSIS — R921 Mammographic calcification found on diagnostic imaging of breast: Secondary | ICD-10-CM

## 2021-05-18 DIAGNOSIS — R928 Other abnormal and inconclusive findings on diagnostic imaging of breast: Secondary | ICD-10-CM

## 2021-05-30 ENCOUNTER — Other Ambulatory Visit (HOSPITAL_COMMUNITY): Payer: Self-pay | Admitting: Diagnostic Radiology

## 2021-05-30 ENCOUNTER — Ambulatory Visit
Admission: RE | Admit: 2021-05-30 | Discharge: 2021-05-30 | Disposition: A | Discharge status: Home / Self Care | Payer: BC Managed Care – PPO | Source: Ambulatory Visit | Attending: Family Medicine | Admitting: Family Medicine

## 2021-05-30 DIAGNOSIS — R921 Mammographic calcification found on diagnostic imaging of breast: Secondary | ICD-10-CM

## 2021-06-30 ENCOUNTER — Other Ambulatory Visit: Payer: Self-pay | Admitting: Family Medicine

## 2021-06-30 DIAGNOSIS — E119 Type 2 diabetes mellitus without complications: Secondary | ICD-10-CM

## 2021-06-30 DIAGNOSIS — E785 Hyperlipidemia, unspecified: Secondary | ICD-10-CM

## 2021-06-30 DIAGNOSIS — E1159 Type 2 diabetes mellitus with other circulatory complications: Secondary | ICD-10-CM

## 2021-09-23 ENCOUNTER — Other Ambulatory Visit: Payer: Self-pay | Admitting: Family Medicine

## 2021-09-23 DIAGNOSIS — E1159 Type 2 diabetes mellitus with other circulatory complications: Secondary | ICD-10-CM

## 2021-09-23 DIAGNOSIS — E785 Hyperlipidemia, unspecified: Secondary | ICD-10-CM

## 2021-10-06 ENCOUNTER — Ambulatory Visit (INDEPENDENT_AMBULATORY_CARE_PROVIDER_SITE_OTHER): Payer: BC Managed Care – PPO | Admitting: Family Medicine

## 2021-10-06 VITALS — BP 132/68 | HR 80 | Temp 97.4°F | Ht 64.0 in | Wt 165.5 lb

## 2021-10-06 DIAGNOSIS — E1159 Type 2 diabetes mellitus with other circulatory complications: Secondary | ICD-10-CM

## 2021-10-06 DIAGNOSIS — E119 Type 2 diabetes mellitus without complications: Secondary | ICD-10-CM | POA: Diagnosis not present

## 2021-10-06 DIAGNOSIS — E1169 Type 2 diabetes mellitus with other specified complication: Secondary | ICD-10-CM | POA: Diagnosis not present

## 2021-10-06 DIAGNOSIS — Z23 Encounter for immunization: Secondary | ICD-10-CM

## 2021-10-06 DIAGNOSIS — I152 Hypertension secondary to endocrine disorders: Secondary | ICD-10-CM

## 2021-10-06 LAB — POCT GLYCOSYLATED HEMOGLOBIN (HGB A1C): Hemoglobin A1C: 6.6 % — AB (ref 4.0–5.6)

## 2021-10-06 NOTE — Assessment & Plan Note (Signed)
Lab Results  Component Value Date   HGBA1C 6.6 (A) 10/06/2021  Hemoglobin A1c is worse from the fall, likely in the setting of poor adherence to metformin.  Discussed restarting daily metformin 500 mg.  Follow-up in 4 months for recheck.

## 2021-10-06 NOTE — Progress Notes (Signed)
   Subjective:     Andrea Rivas is a 61 y.o. female presenting for Follow-up (BP)     HPI  #Diabetes Currently taking metformin as needed - not often at all Using medications without difficulties: Yes Hypoglycemic episodes:No  Hyperglycemic episodes:No  Feet problems:No  Blood Sugars averaging: 130 fasting Last HgbA1c:  Lab Results  Component Value Date   HGBA1C 6.6 (A) 10/06/2021    Diabetes Health Maintenance Due:    Diabetes Health Maintenance Due  Topic Date Due   OPHTHALMOLOGY EXAM  09/03/2020   FOOT EXAM  01/05/2022   HEMOGLOBIN A1C  04/08/2022    #HTN - does not check  - no cp, ha, vision changes - continues to take losartan and amlodpine    Review of Systems   Social History   Tobacco Use  Smoking Status Every Day   Packs/day: 0.50   Years: 15.00   Total pack years: 7.50   Types: Cigarettes  Smokeless Tobacco Former   Types: Snuff, Chew   Quit date: 03/13/1980        Objective:    BP Readings from Last 3 Encounters:  10/06/21 132/68  01/05/21 (!) 182/90  01/16/20 140/90   Wt Readings from Last 3 Encounters:  10/06/21 165 lb 8 oz (75.1 kg)  01/05/21 160 lb 6 oz (72.7 kg)  10/07/20 162 lb (73.5 kg)    BP 132/68   Pulse 80   Temp (!) 97.4 F (36.3 C) (Temporal)   Ht 5\' 4"  (1.626 m)   Wt 165 lb 8 oz (75.1 kg)   SpO2 97%   BMI 28.41 kg/m    Physical Exam        Assessment & Plan:   Problem List Items Addressed This Visit       Cardiovascular and Mediastinum   Hypertension associated with diabetes ([facility]) - Primary    Controlled.  Continue amlodipine 5 mg and losartan 50 mg.        Endocrine   Type 2 diabetes mellitus with other specified complication ([facility])    Lab Results  Component Value Date   HGBA1C 6.6 (A) 10/06/2021  Hemoglobin A1c is worse from the fall, likely in the setting of poor adherence to metformin.  Discussed restarting daily metformin 500 mg.  Follow-up in 4 months for recheck.       Other  Visit Diagnoses     Type 2 diabetes mellitus without complication, without long-term current use of insulin (HCC)       Relevant Orders   POCT glycosylated hemoglobin (Hb A1C) (Completed)   Need for Tdap vaccination       Relevant Orders   Tdap vaccine greater than or equal to 7yo IM (Completed)        Return in about 3 months (around 01/06/2022) for TOC with Matt - annual if able.  01/08/2022, MD

## 2021-10-06 NOTE — Assessment & Plan Note (Signed)
Controlled.  Continue amlodipine 5 mg and losartan 50 mg.

## 2021-10-06 NOTE — Patient Instructions (Signed)
Get your Eye exam - including Diabetic screening - have them send [place] the records

## 2021-10-07 ENCOUNTER — Telehealth: Payer: Self-pay | Admitting: Family Medicine

## 2021-10-07 NOTE — Telephone Encounter (Signed)
Patient called to schedule a TB test. Call back number [phone].

## 2021-10-10 NOTE — Telephone Encounter (Signed)
Ok for tb skin test

## 2021-10-10 NOTE — Telephone Encounter (Signed)
Andrea Rivas verbally says its for work. Pt scheduled for nurse visit on 10/12/21. Need verbal orders.

## 2021-10-12 ENCOUNTER — Other Ambulatory Visit: Payer: Self-pay | Admitting: Family Medicine

## 2021-10-12 ENCOUNTER — Other Ambulatory Visit (INDEPENDENT_AMBULATORY_CARE_PROVIDER_SITE_OTHER): Payer: BC Managed Care – PPO

## 2021-10-12 ENCOUNTER — Ambulatory Visit: Payer: BC Managed Care – PPO

## 2021-10-12 DIAGNOSIS — Z111 Encounter for screening for respiratory tuberculosis: Secondary | ICD-10-CM | POA: Diagnosis not present

## 2021-10-12 LAB — HM DIABETES EYE EXAM

## 2021-10-12 NOTE — Progress Notes (Signed)
Problem List Items Addressed This Visit   None Visit Diagnoses     Screening-pulmonary TB    -  Primary   Relevant Orders   QuantiFERON-TB Gold Plus

## 2021-10-13 DIAGNOSIS — Z0279 Encounter for issue of other medical certificate: Secondary | ICD-10-CM

## 2021-10-13 NOTE — Telephone Encounter (Signed)
Type of forms received:TB screening form  Routed to: [employer] received by : Lesly Rubenstein   Individual made aware of 3-5 business day turn around (Y/N): Y  Form completed and patient made aware of charges(Y/N): Y   Faxed to :   Form location: Place in PCP folder

## 2021-10-13 NOTE — Telephone Encounter (Signed)
Forms received. One page is for patient to fill out and sign prior to faxing forms. Page placed up front and pt notified of form to fill out and then get back to me.

## 2021-10-15 LAB — QUANTIFERON-TB GOLD PLUS
Mitogen-NIL: >10 IU/mL
NIL: 0.02 IU/mL
QuantiFERON-TB Gold Plus: NEGATIVE
TB1-NIL: 0 IU/mL
TB2-NIL: 0 IU/mL

## 2021-10-17 ENCOUNTER — Telehealth: Payer: Self-pay | Admitting: Family Medicine

## 2021-10-17 ENCOUNTER — Encounter: Payer: Self-pay | Admitting: Family Medicine

## 2021-10-17 NOTE — Telephone Encounter (Signed)
Patient came in office to fill out TB test and dropped off eye exam form to be scan in chart and has been placed in PCP folder.

## 2021-10-17 NOTE — Telephone Encounter (Signed)
Forms for pt's work and eye exam (Has been abstracted) placed in providers office box to sign

## 2022-03-29 ENCOUNTER — Other Ambulatory Visit: Payer: Self-pay

## 2022-03-29 DIAGNOSIS — I152 Hypertension secondary to endocrine disorders: Secondary | ICD-10-CM

## 2022-03-29 MED ORDER — LOSARTAN POTASSIUM 50 MG PO TABS: 50.0000 mg | ORAL_TABLET | Freq: Every day | ORAL | 0 refills | Status: DC

## 2022-03-29 MED ORDER — AMLODIPINE BESYLATE 5 MG PO TABS: 5.0000 mg | ORAL_TABLET | Freq: Every day | ORAL | 0 refills | Status: DC

## 2022-04-26 ENCOUNTER — Other Ambulatory Visit: Payer: Self-pay

## 2022-04-26 DIAGNOSIS — E785 Hyperlipidemia, unspecified: Secondary | ICD-10-CM

## 2022-04-26 MED ORDER — ATORVASTATIN CALCIUM 80 MG PO TABS: 80.0000 mg | ORAL_TABLET | Freq: Every day | ORAL | 0 refills | Status: DC

## 2022-05-01 ENCOUNTER — Telehealth: Payer: Self-pay

## 2022-05-01 NOTE — Telephone Encounter (Signed)
Please call patient to see if she is going to set up [facility] appointment with this office. If not let me know so we can removed Dr. Einar Pheasant as PCP

## 2022-05-29 NOTE — Progress Notes (Signed)
Subjective:    Andrea Rivas - 62 y.o. female MRN [id number]  Date of birth: 1960/12/28  HPI  Andrea Rivas is to establish care.   Current issues and/or concerns: - Doing well on blood pressure medications, no issues/concerns. She does not check her blood pressure outside of office. She does monitor salt intake. She does not exercise outside of her normal routine. She denies red flag symptoms such as but not limited to chest pain, shortness of breath, worst headache of life, nausea/vomiting.  - Doing well on cholesterol medication, no issues/concerns.  - States taking Metformin only as needed. She does monitor sugar/carbohydrates intake.  - No further issues/concerns for discussion today.   ROS per HPI    Health Maintenance:  Health Maintenance Due  Topic Date Due   Diabetic kidney evaluation - Urine ACR  Never done   COLONOSCOPY (Pts 45-13yrs Insurance coverage will need to be confirmed)  Never done   COVID-19 Vaccine (4 - 2023-24 season) 11/11/2021   Diabetic kidney evaluation - eGFR measurement  01/05/2022   FOOT EXAM  01/05/2022   HEMOGLOBIN A1C  04/08/2022     Past Medical History: Patient Active Problem List   Diagnosis Date Noted   Urinary urgency 01/16/2020   Strain of lumbar region 01/16/2020   Syncope 10/06/2019   Tobacco use disorder 09/23/2018   Constipation 09/23/2018   Irritable bowel syndrome (IBS) 09/23/2018   Hypertension associated with diabetes ([place]) 03/25/2018   Type 2 diabetes mellitus with other specified complication ([place]) 0000000   Hyperlipidemia 03/25/2018   Cold intolerance 03/25/2018      Social History   reports that she has been smoking cigarettes. She has a 7.50 pack-year smoking history. She quit smokeless tobacco use about 42 years ago.  Her smokeless tobacco use included snuff and chew. She reports current alcohol use of about 3.0 standard drinks of alcohol per week. She reports that she does not use drugs.   Family History   family history includes Alcohol abuse in her brother and brother; Diabetes in her brother, mother, and sister; Drug abuse in her brother and brother; Heart disease in her mother; Hyperlipidemia in her brother, mother, and sister; Hypertension in her mother; Kidney disease in her brother; Kidney failure in her mother; Prostate cancer in her father; Rectal cancer (age of onset: 70) in her mother.   Medications: reviewed and updated   Objective:   Physical Exam BP 134/86 (BP Location: Left Arm, Patient Position: Sitting, Cuff Size: Large)   Pulse 90   Temp 98.3 F (36.8 C)   Resp 16   Ht 5' 3.39" (1.61 m)   Wt 160 lb (72.6 kg)   SpO2 96%   BMI 28.00 kg/m   Physical Exam HENT:     Head: Normocephalic and atraumatic.  Eyes:     Extraocular Movements: Extraocular movements intact.     Conjunctiva/sclera: Conjunctivae normal.     Pupils: Pupils are equal, round, and reactive to light.  Cardiovascular:     Rate and Rhythm: Normal rate and regular rhythm.     Pulses: Normal pulses.     Heart sounds: Normal heart sounds.  Pulmonary:     Effort: Pulmonary effort is normal.     Breath sounds: Normal breath sounds.  Musculoskeletal:     Cervical back: Normal range of motion and neck supple.  Neurological:     General: No focal deficit present.     Mental Status: She is alert and oriented to person,  place, and time.  Psychiatric:        Mood and Affect: Mood normal.        Behavior: Behavior normal.         Assessment & Plan:  1. Encounter to establish care - Patient presents today to establish care. During the interim follow-up with primary provider as scheduled.  - Return for annual physical examination, labs, and health maintenance. Arrive fasting meaning having no food for at least 8 hours prior to appointment. You may have only water or black coffee. Please take scheduled medications as normal.  2. Primary hypertension - Continue Amlodipine and Losartan as prescribed.  -  Routine screening.  - Counseled on blood pressure goal of less than 130/80, low-sodium, DASH diet, medication compliance, and 150 minutes of moderate intensity exercise per week as tolerated. Counseled on medication adherence and adverse effects. - Follow-up with primary provider in 3 months or sooner if needed. - Basic Metabolic Panel - amLODipine (NORVASC) 5 MG tablet; Take 1 tablet (5 mg total) by mouth daily.  Dispense: 90 tablet; Refill: 0 - losartan (COZAAR) 50 MG tablet; Take 1 tablet (50 mg total) by mouth daily.  Dispense: 90 tablet; Refill: 0  3. Type 2 diabetes mellitus with hyperglycemia, without long-term current use of insulin (HCC) - Continue Metformin as prescribed.  - Routine screening.  - Discussed the importance of healthy eating habits, low-carbohydrate diet, low-sugar diet, regular aerobic exercise (at least 150 minutes a week as tolerated) and medication compliance to achieve or maintain control of diabetes. - Follow-up with primary provider as scheduled.  - Hemoglobin A1c - metFORMIN (GLUCOPHAGE) 500 MG tablet; Take 1 tablet (500 mg total) by mouth daily with breakfast.  Dispense: 90 tablet; Refill: 0  4. Hyperlipidemia, unspecified hyperlipidemia type - Practice low-fat heart healthy diet and at least 150 minutes of moderate intensity exercise weekly as tolerated.  - Continue Atorvastatin as prescribed.  - Follow-up with primary provider as scheduled. - atorvastatin (LIPITOR) 80 MG tablet; Take 1 tablet (80 mg total) by mouth daily.  Dispense: 90 tablet; Refill: 0    Patient was given clear instructions to go to Emergency Department or return to medical center if symptoms don't improve, worsen, or new problems develop.The patient verbalized understanding.  I discussed the assessment and treatment plan with the patient. The patient was provided an opportunity to ask questions and all were answered. The patient agreed with the plan and demonstrated an understanding of  the instructions.   The patient was advised to call back or seek an in-person evaluation if the symptoms worsen or if the condition fails to improve as anticipated.    Andrea Fruits, NP 06/02/2022, 1:32 PM Primary Care at [facility]

## 2022-06-02 ENCOUNTER — Encounter: Payer: Self-pay | Admitting: Family

## 2022-06-02 ENCOUNTER — Ambulatory Visit: Payer: BC Managed Care – PPO | Admitting: Family

## 2022-06-02 VITALS — BP 134/86 | HR 90 | Temp 98.3°F | Resp 16 | Ht 63.39 in | Wt 160.0 lb

## 2022-06-02 DIAGNOSIS — I10 Essential (primary) hypertension: Secondary | ICD-10-CM

## 2022-06-02 DIAGNOSIS — F17210 Nicotine dependence, cigarettes, uncomplicated: Secondary | ICD-10-CM

## 2022-06-02 DIAGNOSIS — E785 Hyperlipidemia, unspecified: Secondary | ICD-10-CM | POA: Diagnosis not present

## 2022-06-02 DIAGNOSIS — Z7689 Persons encountering health services in other specified circumstances: Secondary | ICD-10-CM

## 2022-06-02 DIAGNOSIS — E1165 Type 2 diabetes mellitus with hyperglycemia: Secondary | ICD-10-CM | POA: Diagnosis not present

## 2022-06-02 MED ORDER — LOSARTAN POTASSIUM 50 MG PO TABS: 50.0000 mg | ORAL_TABLET | Freq: Every day | ORAL | 0 refills | Status: DC

## 2022-06-02 MED ORDER — AMLODIPINE BESYLATE 5 MG PO TABS: 5.0000 mg | ORAL_TABLET | Freq: Every day | ORAL | 0 refills | Status: DC

## 2022-06-02 MED ORDER — ATORVASTATIN CALCIUM 80 MG PO TABS: 80.0000 mg | ORAL_TABLET | Freq: Every day | ORAL | 0 refills | Status: DC

## 2022-06-02 MED ORDER — METFORMIN HCL 500 MG PO TABS: 500.0000 mg | ORAL_TABLET | Freq: Every day | ORAL | 0 refills | Status: DC

## 2022-06-02 NOTE — Patient Instructions (Signed)
Thank you for choosing Primary Care at [facility] for your medical home!    Andrea Rivas was seen by Camillia Herter, NP today.   Andrea Rivas's primary care provider is Camillia Herter, NP.   For the best care possible,  you should try to see Andrea Fruits, NP whenever you come to office.   We look forward to seeing you again soon!  If you have any questions about your visit today,  please call us at [phone]  Or feel free to reach your provider via [place].   Keeping you healthy   Get these tests Blood pressure- Have your blood pressure checked once a year by your healthcare provider.  Normal blood pressure is 120/80. Weight- Have your body mass index (BMI) calculated to screen for obesity.  BMI is a measure of body fat based on height and weight. You can also calculate your own BMI at [employer]velB[employer].it. Cholesterol- Have your cholesterol checked regularly starting at age 23, sooner may be necessary if you have diabetes, high blood pressure, if a family member developed heart diseases at an early age or if you smoke.  Chlamydia, HIV, and other sexual transmitted disease- Get screened each year until the age of 50 then within three months of each new sexual partner. Diabetes- Have your blood sugar checked regularly if you have high blood pressure, high cholesterol, a family history of diabetes or if you are overweight.   Get these vaccines Flu shot- Every fall. Tetanus shot- Every 10 years. Menactra- Single dose; prevents meningitis.   Take these steps Don't smoke- If you do smoke, ask your healthcare provider about quitting. For tips on how to quit, go to www.smokefree.gov or call 1-800-QUIT-NOW. Be physically active- Exercise 5 days a week for at least 30 minutes.  If you are not already physically active start slow and gradually work up to 30 minutes of moderate physical activity.  Examples of moderate activity include walking briskly, mowing the yard, dancing,  swimming bicycling, etc. Eat a healthy diet- Eat a variety of healthy foods such as Rivas, vegetables, low fat milk, low fat cheese, yogurt, lean meats, poultry, fish, beans, tofu, etc.  For more information on healthy eating, go to www.thenutritionsource.org Drink alcohol in moderation- Limit alcohol intake two drinks or less a day.  Never drink and drive. Dentist- Brush and floss teeth twice daily; visit your dentis twice a year. Depression-Your emotional health is as important as your physical health.  If you're feeling down, losing interest in things you normally enjoy please talk with your healthcare provider. Gun Safety- If you keep a gun in your home, keep it unloaded and with the safety lock on.  Bullets should be stored separately. Helmet use- Always wear a helmet when riding a motorcycle, bicycle, rollerblading or skateboarding. Safe sex- If you may be exposed to a sexually transmitted infection, use a condom Seat belts- Seat bels can save your life; always wear one. Smoke/Carbon Monoxide detectors- These detectors need to be installed on the appropriate level of your home.  Replace batteries at least once a year. Skin Cancer- When out in the sun, cover up and use sunscreen SPF 15 or higher. Violence- If anyone is threatening or hurting you, please tell your healthcare provider.

## 2022-06-02 NOTE — Progress Notes (Signed)
.  Pt presents to establish care,  -last seen by a provider about year ago -needs refill on Amlodipine, Atorvastatin, Losartan, and Metformin

## 2022-06-05 LAB — BASIC METABOLIC PANEL
BUN/Creatinine Ratio: 14 (ref 12–28)
BUN: 11 mg/dL (ref 8–27)
CO2: 20 mmol/L (ref 20–29)
Calcium: 9.6 mg/dL (ref 8.7–10.3)
Chloride: 100 mmol/L (ref 96–106)
Creatinine, Ser: 0.78 mg/dL (ref 0.57–1.00)
Glucose: 287 mg/dL — ABNORMAL HIGH (ref 70–99)
Potassium: 4.5 mmol/L (ref 3.5–5.2)
Sodium: 137 mmol/L (ref 134–144)
eGFR: 87 mL/min/{1.73_m2} (ref >59)

## 2022-06-05 LAB — HEMOGLOBIN A1C
Est. average glucose Bld gHb Est-mCnc: 255 mg/dL
Hgb A1c MFr Bld: 10.5 % — ABNORMAL HIGH (ref 4.8–5.6)

## 2022-09-06 ENCOUNTER — Ambulatory Visit (INDEPENDENT_AMBULATORY_CARE_PROVIDER_SITE_OTHER): Payer: BC Managed Care – PPO | Admitting: Family

## 2022-09-06 ENCOUNTER — Ambulatory Visit: Payer: BC Managed Care – PPO | Admitting: Family

## 2022-09-06 ENCOUNTER — Encounter: Payer: Self-pay | Admitting: Family

## 2022-09-06 VITALS — BP 131/84 | HR 71 | Temp 98.2°F | Ht 64.0 in | Wt 159.6 lb

## 2022-09-06 DIAGNOSIS — Z0001 Encounter for general adult medical examination with abnormal findings: Secondary | ICD-10-CM | POA: Diagnosis not present

## 2022-09-06 DIAGNOSIS — E1165 Type 2 diabetes mellitus with hyperglycemia: Secondary | ICD-10-CM | POA: Diagnosis not present

## 2022-09-06 DIAGNOSIS — Z7984 Long term (current) use of oral hypoglycemic drugs: Secondary | ICD-10-CM

## 2022-09-06 DIAGNOSIS — I10 Essential (primary) hypertension: Secondary | ICD-10-CM | POA: Diagnosis not present

## 2022-09-06 DIAGNOSIS — Z130 Encounter for screening for diseases of the blood and blood-forming organs and certain disorders involving the immune mechanism: Secondary | ICD-10-CM

## 2022-09-06 DIAGNOSIS — F17220 Nicotine dependence, chewing tobacco, uncomplicated: Secondary | ICD-10-CM

## 2022-09-06 DIAGNOSIS — E785 Hyperlipidemia, unspecified: Secondary | ICD-10-CM

## 2022-09-06 DIAGNOSIS — Z1211 Encounter for screening for malignant neoplasm of colon: Secondary | ICD-10-CM

## 2022-09-06 DIAGNOSIS — F17210 Nicotine dependence, cigarettes, uncomplicated: Secondary | ICD-10-CM

## 2022-09-06 DIAGNOSIS — E119 Type 2 diabetes mellitus without complications: Secondary | ICD-10-CM

## 2022-09-06 DIAGNOSIS — Z1231 Encounter for screening mammogram for malignant neoplasm of breast: Secondary | ICD-10-CM

## 2022-09-06 DIAGNOSIS — Z13228 Encounter for screening for other metabolic disorders: Secondary | ICD-10-CM

## 2022-09-06 DIAGNOSIS — Z0000 Encounter for general adult medical examination without abnormal findings: Secondary | ICD-10-CM

## 2022-09-06 DIAGNOSIS — Z1329 Encounter for screening for other suspected endocrine disorder: Secondary | ICD-10-CM

## 2022-09-06 MED ORDER — LOSARTAN POTASSIUM 50 MG PO TABS: 50.0000 mg | ORAL_TABLET | Freq: Every day | ORAL | 0 refills | Status: DC

## 2022-09-06 MED ORDER — METFORMIN HCL ER 500 MG PO TB24
500.0000 mg | ORAL_TABLET | Freq: Two times a day (BID) | ORAL | 0 refills | Status: AC
Start: 2022-09-06 — End: 2022-12-05

## 2022-09-06 MED ORDER — ATORVASTATIN CALCIUM 80 MG PO TABS: 80.0000 mg | ORAL_TABLET | Freq: Every day | ORAL | 0 refills | Status: DC

## 2022-09-06 MED ORDER — AMLODIPINE BESYLATE 5 MG PO TABS: 5.0000 mg | ORAL_TABLET | Freq: Every day | ORAL | 0 refills | Status: DC

## 2022-09-06 NOTE — Progress Notes (Signed)
Pt wants to have A1c checked  Pt states metformin irritates her stomach

## 2022-09-06 NOTE — Progress Notes (Signed)
Patient ID: [name], female    DOB: 03/27/1960  MRN: [id number]  CC: Annual Exam  Subjective: Andrea Rivas is a 62 y.o. female who presents for annual exam.   Her concerns today include:  - Doing well on Amlodipine and Losartan, no issues/concerns. She does not complain of red flag symptoms such as but not limited to chest pain, shortness of breath, worst headache of life, nausea/vomiting.  - Metformin causing upset stomach. She denies red flag symptoms associated with diabetes.  - Reports established with Ophthalmology for diabetic eye exams.  - Declines pap smear due to history of hysterectomy.   Patient Active Problem List   Diagnosis Date Noted   Urinary urgency 01/16/2020   Strain of lumbar region 01/16/2020   Syncope 10/06/2019   Tobacco use disorder 09/23/2018   Constipation 09/23/2018   Irritable bowel syndrome (IBS) 09/23/2018   Hypertension associated with diabetes (HCC) 03/25/2018   Type 2 diabetes mellitus with other specified complication (HCC) 03/25/2018   Hyperlipidemia 03/25/2018   Cold intolerance 03/25/2018     Current Outpatient Medications on File Prior to Visit  Medication Sig Dispense Refill   Multiple Vitamins-Minerals (CENTRUM SILVER PO) Take 1 tablet by mouth daily.     No current facility-administered medications on file prior to visit.    Allergies  Allergen Reactions   Tramadol Itching    Social History   Socioeconomic History   Marital status: Widowed    Spouse name: Not on file   Number of children: 2   Years of education: High school   Highest education level: Not on file  Occupational History   Occupation: retired, TA  Tobacco Use   Smoking status: Every Day    Packs/day: 0.50    Years: 15.00    Additional pack years: 0.00    Total pack years: 7.50    Types: Cigarettes   Smokeless tobacco: Former    Types: Snuff, Chew    Quit date: 03/13/1980  Vaping Use   Vaping Use: Never used  Substance and Sexual Activity    Alcohol use: Yes    Alcohol/week: 3.0 standard drinks of alcohol    Types: 3 Cans of beer per week    Comment: weekend, 3, 16 oz beers   Drug use: Never   Sexual activity: Not Currently  Other Topics Concern   Not on file  Social History Narrative   Lives with Daughter - Andrea Rivas and Son Andrea Rivas - good relationship   5 Grandchildren - 3 girls and 2 boys age range 2-17   Enjoys: playing bingo, fishing   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   Exercise: walking   Diet: eats a large lunch, tries to do low carb diabetic diet   Social Determinants of Corporate investment banker Strain: Low Risk  (03/25/2018)   Overall Financial Resource Strain (CARDIA)    Difficulty of Paying Living Expenses: Not hard at all  Food Insecurity: Not on file  Transportation Needs: Not on file  Physical Activity: Not on file  Stress: Not on file  Social Connections: Not on file  Intimate Partner Violence: Not on file    Family History  Problem Relation Age of Onset   Diabetes Mother    Hyperlipidemia Mother    Hypertension Mother    Kidney failure Mother    Rectal cancer Mother 52   Heart disease Mother    Prostate cancer Father    Diabetes Sister  Hyperlipidemia Sister    Kidney disease Brother    Hyperlipidemia Brother    Drug abuse Brother    Diabetes Brother    Alcohol abuse Brother    Alcohol abuse Brother    Drug abuse Brother    Breast cancer Neg Hx     Past Surgical History:  Procedure Laterality Date   ABDOMINAL HYSTERECTOMY     still has both ovaries, cervix removed   BREAST REDUCTION SURGERY Bilateral    CESAREAN SECTION  1982   REDUCTION MAMMAPLASTY      ROS: Review of Systems Negative except as stated above  PHYSICAL EXAM: BP 131/84   Pulse 71   Temp 98.2 F (36.8 C) (Oral)   Ht 5\' 4"  (1.626 m)   Wt 159 lb 9.6 oz (72.4 kg)   SpO2 97%   BMI 27.40 kg/m   Physical Exam HENT:     Head: Normocephalic and atraumatic.     Right Ear: Tympanic  membrane, ear canal and external ear normal.     Left Ear: Tympanic membrane, ear canal and external ear normal.     Nose: Nose normal.     Mouth/Throat:     Mouth: Mucous membranes are moist.     Pharynx: Oropharynx is clear.  Eyes:     Extraocular Movements: Extraocular movements intact.     Conjunctiva/sclera: Conjunctivae normal.     Pupils: Pupils are equal, round, and reactive to light.  Cardiovascular:     Rate and Rhythm: Normal rate and regular rhythm.     Pulses: Normal pulses.     Heart sounds: Normal heart sounds.  Pulmonary:     Effort: Pulmonary effort is normal.     Breath sounds: Normal breath sounds.  Chest:     Comments: Patient declined.  Abdominal:     General: Bowel sounds are normal.     Palpations: Abdomen is soft.  Genitourinary:    Comments: Patient declined.  Musculoskeletal:        General: Normal range of motion.     Right shoulder: Normal.     Left shoulder: Normal.     Right upper arm: Normal.     Left upper arm: Normal.     Right elbow: Normal.     Left elbow: Normal.     Right forearm: Normal.     Left forearm: Normal.     Right wrist: Normal.     Left wrist: Normal.     Right hand: Normal.     Left hand: Normal.     Cervical back: Normal, normal range of motion and neck supple.     Thoracic back: Normal.     Lumbar back: Normal.     Right hip: Normal.     Left hip: Normal.     Right upper leg: Normal.     Left upper leg: Normal.     Right knee: Normal.     Left knee: Normal.     Right lower leg: Normal.     Left lower leg: Normal.     Right ankle: Normal.     Left ankle: Normal.     Right foot: Normal.     Left foot: Normal.  Skin:    General: Skin is warm and dry.     Capillary Refill: Capillary refill takes less than 2 seconds.  Neurological:     General: No focal deficit present.     Mental Status: She is alert and oriented to person, place,  and time.  Psychiatric:        Mood and Affect: Mood normal.        Behavior:  Behavior normal.    Diabetic Foot Exam - Simple   Simple Foot Form Visual Inspection No deformities, no ulcerations, no other skin breakdown bilaterally: Yes Sensation Testing Intact to touch and monofilament testing bilaterally: Yes Pulse Check Posterior Tibialis and Dorsalis pulse intact bilaterally: Yes Comments     ASSESSMENT AND PLAN: 1. Annual physical exam - Counseled on 150 minutes of exercise per week as tolerated, healthy eating (including decreased daily intake of saturated fats, cholesterol, added sugars, sodium), STI prevention, and routine healthcare maintenance.  2. Screening for metabolic disorder - Routine screening.  - CMP14+EGFR  3. Screening for deficiency anemia - Routine screening.  - CBC  4. Primary hypertension - Continue Amlodipine and Losartan as prescribed.  - Counseled on blood pressure goal of less than 130/80, low-sodium, DASH diet, medication compliance, and 150 minutes of moderate intensity exercise per week as tolerated. Counseled on medication adherence and adverse effects. - Follow-up with primary provider in 3 months or sooner if needed.  - amLODipine (NORVASC) 5 MG tablet; Take 1 tablet (5 mg total) by mouth daily.  Dispense: 90 tablet; Refill: 0 - losartan (COZAAR) 50 MG tablet; Take 1 tablet (50 mg total) by mouth daily.  Dispense: 90 tablet; Refill: 0  5. Type 2 diabetes mellitus with hyperglycemia, without long-term current use of insulin (HCC) - Metformin discontinued related to gastrointestinal side effects.  - Trial Metformin XR as prescribed. Counseled on medication adherence/adverse effects.  - Routine screening.  - Discussed the importance of healthy eating habits, low-carbohydrate diet, low-sugar diet, regular aerobic exercise (at least 150 minutes a week as tolerated) and medication compliance to achieve or maintain control of diabetes. - Follow-up with primary provider in 4 weeks or sooner if needed.  - Hemoglobin A1c -  Microalbumin / creatinine urine ratio - metFORMIN (GLUCOPHAGE-XR) 500 MG 24 hr tablet; Take 1 tablet (500 mg total) by mouth 2 (two) times daily with a meal.  Dispense: 180 tablet; Refill: 0  6. Diabetic eye exam (HCC) - Keep all scheduled appointments with Ophthalmology.  7. Encounter for diabetic foot exam (HCC) - Completed today in office.  8. Hyperlipidemia, unspecified hyperlipidemia type - Continue Atorvastatin as prescribed.  - Routine screening.  - Follow-up with primary provider as scheduled.  - Lipid panel - atorvastatin (LIPITOR) 80 MG tablet; Take 1 tablet (80 mg total) by mouth daily.  Dispense: 90 tablet; Refill: 0  9. Thyroid disorder screen - Routine screening.  - TSH  10. Encounter for screening mammogram for malignant neoplasm of breast - Routine screening.  - MM Digital Screening; Future  11. Colon cancer screening - Referral to Gastroenterology for colon cancer screening by colonoscopy. - Ambulatory referral to Gastroenterology   Patient was given the opportunity to ask questions.  Patient verbalized understanding of the plan and was able to repeat key elements of the plan. Patient was given clear instructions to go to Emergency Department or return to medical center if symptoms don't improve, worsen, or new problems develop.The patient verbalized understanding.   Orders Placed This Encounter  Procedures   MM Digital Screening   CBC   Lipid panel   TSH   CMP14+EGFR   Hemoglobin A1c   Microalbumin / creatinine urine ratio   Ambulatory referral to Gastroenterology     Requested Prescriptions   Signed Prescriptions Disp Refills   metFORMIN (  GLUCOPHAGE-XR) 500 MG 24 hr tablet 180 tablet 0    Sig: Take 1 tablet (500 mg total) by mouth 2 (two) times daily with a meal.   atorvastatin (LIPITOR) 80 MG tablet 90 tablet 0    Sig: Take 1 tablet (80 mg total) by mouth daily.   amLODipine (NORVASC) 5 MG tablet 90 tablet 0    Sig: Take 1 tablet (5 mg total)  by mouth daily.   losartan (COZAAR) 50 MG tablet 90 tablet 0    Sig: Take 1 tablet (50 mg total) by mouth daily.    Return in about 1 year (around 09/06/2023) for Physical per patient preference.  Rema Fendt, NP

## 2022-09-06 NOTE — Patient Instructions (Signed)
Preventive Care 40-61 Years Old, Female Preventive care refers to lifestyle choices and visits with your health care provider that can promote health and wellness. Preventive care visits are also called wellness exams. What can I expect for my preventive care visit? Counseling Your health care provider may ask you questions about your: Medical history, including: Past medical problems. Family medical history. Pregnancy history. Current health, including: Menstrual cycle. Method of birth control. Emotional well-being. Home life and relationship well-being. Sexual activity and sexual health. Lifestyle, including: Alcohol, nicotine or tobacco, and drug use. Access to firearms. Diet, exercise, and sleep habits. Work and work environment. Sunscreen use. Safety issues such as seatbelt and bike helmet use. Physical exam Your health care provider will check your: Height and weight. These may be used to calculate your BMI (body mass index). BMI is a measurement that tells if you are at a healthy weight. Waist circumference. This measures the distance around your waistline. This measurement also tells if you are at a healthy weight and may help predict your risk of certain diseases, such as type 2 diabetes and high blood pressure. Heart rate and blood pressure. Body temperature. Skin for abnormal spots. What immunizations do I need?  Vaccines are usually given at various ages, according to a schedule. Your health care provider will recommend vaccines for you based on your age, medical history, and lifestyle or other factors, such as travel or where you work. What tests do I need? Screening Your health care provider may recommend screening tests for certain conditions. This may include: Lipid and cholesterol levels. Diabetes screening. This is done by checking your blood sugar (glucose) after you have not eaten for a while (fasting). Pelvic exam and Pap test. Hepatitis B test. Hepatitis C  test. HIV (human immunodeficiency virus) test. STI (sexually transmitted infection) testing, if you are at risk. Lung cancer screening. Colorectal cancer screening. Mammogram. Talk with your health care provider about when you should start having regular mammograms. This may depend on whether you have a family history of breast cancer. BRCA-related cancer screening. This may be done if you have a family history of breast, ovarian, tubal, or peritoneal cancers. Bone density scan. This is done to screen for osteoporosis. Talk with your health care provider about your test results, treatment options, and if necessary, the need for more tests. Follow these instructions at home: Eating and drinking  Eat a diet that includes fresh fruits and vegetables, whole grains, lean protein, and low-fat dairy products. Take vitamin and mineral supplements as recommended by your health care provider. Do not drink alcohol if: Your health care provider tells you not to drink. You are pregnant, may be pregnant, or are planning to become pregnant. If you drink alcohol: Limit how much you have to 0-1 drink a day. Know how much alcohol is in your drink. In the [place] one drink equals one 12 oz bottle of beer (355 mL), one 5 oz glass of wine (148 mL), or one 1 oz glass of hard liquor (44 mL). Lifestyle Brush your teeth every morning and night with fluoride toothpaste. Floss one time each day. Exercise for at least 30 minutes 5 or more days each week. Do not use any products that contain nicotine or tobacco. These products include cigarettes, chewing tobacco, and vaping devices, such as e-cigarettes. If you need help quitting, ask your health care provider. Do not use drugs. If you are sexually active, practice safe sex. Use a condom or other form of protection to   prevent STIs. If you do not wish to become pregnant, use a form of birth control. If you plan to become pregnant, see your health care provider for a  prepregnancy visit. Take aspirin only as told by your health care provider. Make sure that you understand how much to take and what form to take. Work with your health care provider to find out whether it is safe and beneficial for you to take aspirin daily. Find healthy ways to manage stress, such as: Meditation, yoga, or listening to music. Journaling. Talking to a trusted person. Spending time with friends and family. Minimize exposure to UV radiation to reduce your risk of skin cancer. Safety Always wear your seat belt while driving or riding in a vehicle. Do not drive: If you have been drinking alcohol. Do not ride with someone who has been drinking. When you are tired or distracted. While texting. If you have been using any mind-altering substances or drugs. Wear a helmet and other protective equipment during sports activities. If you have firearms in your house, make sure you follow all gun safety procedures. Seek help if you have been physically or sexually abused. What's next? Visit your health care provider once a year for an annual wellness visit. Ask your health care provider how often you should have your eyes and teeth checked. Stay up to date on all vaccines. This information is not intended to replace advice given to you by your health care provider. Make sure you discuss any questions you have with your health care provider. Document Revised: 08/25/2020 Document Reviewed: 08/25/2020 Elsevier Patient Education  2024 Elsevier Inc.  

## 2022-09-07 LAB — CMP14+EGFR
ALT: 11 IU/L (ref 0–32)
AST: 17 IU/L (ref 0–40)
Albumin: 4.5 g/dL (ref 3.9–4.9)
Alkaline Phosphatase: 78 IU/L (ref 44–121)
BUN/Creatinine Ratio: 11 — ABNORMAL LOW (ref 12–28)
BUN: 8 mg/dL (ref 8–27)
Bilirubin Total: 0.5 mg/dL (ref 0.0–1.2)
CO2: 25 mmol/L (ref 20–29)
Calcium: 9.3 mg/dL (ref 8.7–10.3)
Chloride: 102 mmol/L (ref 96–106)
Creatinine, Ser: 0.73 mg/dL (ref 0.57–1.00)
Globulin, Total: 2.3 g/dL (ref 1.5–4.5)
Glucose: 152 mg/dL — ABNORMAL HIGH (ref 70–99)
Potassium: 3.9 mmol/L (ref 3.5–5.2)
Sodium: 140 mmol/L (ref 134–144)
Total Protein: 6.8 g/dL (ref 6.0–8.5)
eGFR: 94 mL/min/{1.73_m2} (ref >59)

## 2022-09-07 LAB — LIPID PANEL
Chol/HDL Ratio: 2.4 ratio (ref 0.0–4.4)
Cholesterol, Total: 176 mg/dL (ref 100–199)
HDL: 72 mg/dL (ref >39)
LDL Chol Calc (NIH): 77 mg/dL (ref 0–99)
Triglycerides: 161 mg/dL — ABNORMAL HIGH (ref 0–149)
VLDL Cholesterol Cal: 27 mg/dL (ref 5–40)

## 2022-09-07 LAB — CBC
Hematocrit: 37.8 % (ref 34.0–46.6)
Hemoglobin: 12.2 g/dL (ref 11.1–15.9)
MCH: 27.1 pg (ref 26.6–33.0)
MCHC: 32.3 g/dL (ref 31.5–35.7)
MCV: 84 fL (ref 79–97)
Platelets: 236 10*3/uL (ref 150–450)
RBC: 4.51 x10E6/uL (ref 3.77–5.28)
RDW: 10.9 % — ABNORMAL LOW (ref 11.7–15.4)
WBC: 7.1 10*3/uL (ref 3.4–10.8)

## 2022-09-07 LAB — HEMOGLOBIN A1C
Est. average glucose Bld gHb Est-mCnc: 140 mg/dL
Hgb A1c MFr Bld: 6.5 % — ABNORMAL HIGH (ref 4.8–5.6)

## 2022-09-07 LAB — TSH: TSH: 0.453 u[IU]/mL (ref 0.450–4.500)

## 2022-09-07 LAB — MICROALBUMIN / CREATININE URINE RATIO

## 2022-10-03 ENCOUNTER — Ambulatory Visit
Admission: RE | Admit: 2022-10-03 | Discharge: 2022-10-03 | Disposition: A | Discharge status: Home / Self Care | Payer: BC Managed Care – PPO | Source: Ambulatory Visit | Attending: Family | Admitting: Family

## 2022-10-03 DIAGNOSIS — Z1231 Encounter for screening mammogram for malignant neoplasm of breast: Secondary | ICD-10-CM

## 2022-10-05 ENCOUNTER — Telehealth: Payer: Self-pay | Admitting: Family

## 2022-10-05 NOTE — Telephone Encounter (Signed)
Called her back and gave results.

## 2022-10-05 NOTE — Telephone Encounter (Signed)
Copied from CRM [id number]. Topic: General - Other >> Oct 05, 2022  1:59 PM Turkey B wrote: Reason for CRM: pt called in asking to speak with Ethelene Browns, says she was told to call him back. She states at 4pm is a good time to call back

## 2022-12-08 ENCOUNTER — Encounter: Payer: Self-pay | Admitting: Family

## 2022-12-08 ENCOUNTER — Ambulatory Visit (INDEPENDENT_AMBULATORY_CARE_PROVIDER_SITE_OTHER): Payer: BC Managed Care – PPO | Admitting: Family

## 2022-12-08 VITALS — BP 132/81 | HR 109 | Temp 98.8°F | Ht 63.0 in | Wt 152.4 lb

## 2022-12-08 DIAGNOSIS — Z0100 Encounter for examination of eyes and vision without abnormal findings: Secondary | ICD-10-CM

## 2022-12-08 DIAGNOSIS — I10 Essential (primary) hypertension: Secondary | ICD-10-CM | POA: Diagnosis not present

## 2022-12-08 DIAGNOSIS — Z7984 Long term (current) use of oral hypoglycemic drugs: Secondary | ICD-10-CM

## 2022-12-08 DIAGNOSIS — E785 Hyperlipidemia, unspecified: Secondary | ICD-10-CM

## 2022-12-08 DIAGNOSIS — E1165 Type 2 diabetes mellitus with hyperglycemia: Secondary | ICD-10-CM | POA: Diagnosis not present

## 2022-12-08 DIAGNOSIS — E119 Type 2 diabetes mellitus without complications: Secondary | ICD-10-CM

## 2022-12-08 LAB — POCT GLYCOSYLATED HEMOGLOBIN (HGB A1C): HbA1c, POC (controlled diabetic range): 5.8 % (ref 0.0–7.0)

## 2022-12-08 MED ORDER — ATORVASTATIN CALCIUM 80 MG PO TABS
80.0000 mg | ORAL_TABLET | Freq: Every day | ORAL | 0 refills | Status: DC
Start: 2022-12-08 — End: 2023-05-07

## 2022-12-08 MED ORDER — METFORMIN HCL ER 500 MG PO TB24
500.0000 mg | ORAL_TABLET | Freq: Two times a day (BID) | ORAL | 0 refills | Status: AC
Start: 2022-12-08 — End: 2023-03-08

## 2022-12-08 MED ORDER — AMLODIPINE BESYLATE 5 MG PO TABS
5.0000 mg | ORAL_TABLET | Freq: Every day | ORAL | 0 refills | Status: DC
Start: 2022-12-08 — End: 2023-03-26

## 2022-12-08 MED ORDER — LOSARTAN POTASSIUM 50 MG PO TABS
50.0000 mg | ORAL_TABLET | Freq: Every day | ORAL | 0 refills | Status: DC
Start: 2022-12-08 — End: 2023-03-26

## 2022-12-08 NOTE — Progress Notes (Signed)
Patient states no concerns. Denies Flu vaccine.

## 2022-12-08 NOTE — Progress Notes (Signed)
Patient ID: [name], female    DOB: 1961-01-16  MRN: [id number]  CC: Chronic Conditions Follow-Up  Subjective: Andrea Rivas is a 62 y.o. female who presents for chronic conditions follow-up.   Her concerns today include:  - Doing well on Amlodipine and Losartan, no issues/concerns. She does not complain of red flag symptoms such as but not limited to chest pain, shortness of breath, worst headache of life, nausea/vomiting.  - Doing well on Metformin XR, no issues/concerns. She denies red flag symptoms associated with diabetes. Patient states she is unable to urinate to recollect microalbumin / creatinine urine ratio. - Doing well on Atorvastatin, no issues/concerns.   Patient Active Problem List   Diagnosis Date Noted   Urinary urgency 01/16/2020   Strain of lumbar region 01/16/2020   Syncope 10/06/2019   Tobacco use disorder 09/23/2018   Constipation 09/23/2018   Irritable bowel syndrome (IBS) 09/23/2018   Hypertension associated with diabetes (HCC) 03/25/2018   Type 2 diabetes mellitus with other specified complication (HCC) 03/25/2018   Hyperlipidemia 03/25/2018   Cold intolerance 03/25/2018     Current Outpatient Medications on File Prior to Visit  Medication Sig Dispense Refill   Multiple Vitamins-Minerals (CENTRUM SILVER PO) Take 1 tablet by mouth daily.     No current facility-administered medications on file prior to visit.    Allergies  Allergen Reactions   Tramadol Itching    Social History   Socioeconomic History   Marital status: Widowed    Spouse name: Not on file   Number of children: 2   Years of education: High school   Highest education level: Not on file  Occupational History   Occupation: retired, TA  Tobacco Use   Smoking status: Every Day    Current packs/day: 0.50    Average packs/day: 0.5 packs/day for 15.0 years (7.5 ttl pk-yrs)    Types: Cigarettes   Smokeless tobacco: Former    Types: Snuff, Chew    Quit date: 03/13/1980   Vaping Use   Vaping status: Never Used  Substance and Sexual Activity   Alcohol use: Yes    Alcohol/week: 3.0 standard drinks of alcohol    Types: 3 Cans of beer per week    Comment: weekend, 3, 16 oz beers   Drug use: Never   Sexual activity: Not Currently  Other Topics Concern   Not on file  Social History Narrative   Lives with Daughter - Andrea Rivas and Son Andrea Rivas - good relationship   5 Grandchildren - 3 girls and 2 boys age range 2-17   Enjoys: playing bingo, fishing   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   Exercise: walking   Diet: eats a large lunch, tries to do low carb diabetic diet   Social Determinants of Corporate investment banker Strain: Low Risk  (03/25/2018)   Overall Financial Resource Strain (CARDIA)    Difficulty of Paying Living Expenses: Not hard at all  Food Insecurity: Not on file  Transportation Needs: Not on file  Physical Activity: Not on file  Stress: Not on file  Social Connections: Not on file  Intimate Partner Violence: Not on file    Family History  Problem Relation Age of Onset   Diabetes Mother    Hyperlipidemia Mother    Hypertension Mother    Kidney failure Mother    Rectal cancer Mother 14   Heart disease Mother    Prostate cancer Father    Diabetes  Sister    Hyperlipidemia Sister    Kidney disease Brother    Hyperlipidemia Brother    Drug abuse Brother    Diabetes Brother    Alcohol abuse Brother    Alcohol abuse Brother    Drug abuse Brother    Breast cancer Neg Hx     Past Surgical History:  Procedure Laterality Date   ABDOMINAL HYSTERECTOMY     still has both ovaries, cervix removed   BREAST REDUCTION SURGERY Bilateral    CESAREAN SECTION  1982   REDUCTION MAMMAPLASTY      ROS: Review of Systems Negative except as stated above  PHYSICAL EXAM: BP 132/81   Pulse (!) 109   Temp 98.8 F (37.1 C) (Oral)   Ht 5\' 3"  (1.6 m)   Wt 152 lb 6.4 oz (69.1 kg)   SpO2 96%   BMI 27.00 kg/m   Physical  Exam HENT:     Head: Normocephalic and atraumatic.     Nose: Nose normal.     Mouth/Throat:     Mouth: Mucous membranes are moist.     Pharynx: Oropharynx is clear.  Eyes:     Extraocular Movements: Extraocular movements intact.     Conjunctiva/sclera: Conjunctivae normal.     Pupils: Pupils are equal, round, and reactive to light.  Cardiovascular:     Rate and Rhythm: Normal rate and regular rhythm.     Pulses: Normal pulses.     Heart sounds: Normal heart sounds.  Pulmonary:     Effort: Pulmonary effort is normal.     Breath sounds: Normal breath sounds.  Musculoskeletal:        General: Normal range of motion.     Cervical back: Normal range of motion and neck supple.  Neurological:     General: No focal deficit present.     Mental Status: She is alert and oriented to person, place, and time.  Psychiatric:        Mood and Affect: Mood normal.        Behavior: Behavior normal.     ASSESSMENT AND PLAN: 1. Primary hypertension - Continue Amlodipine and Losartan as prescribed.  - Counseled on blood pressure goal of less than 130/80, low-sodium, DASH diet, medication compliance, and 150 minutes of moderate intensity exercise per week as tolerated. Counseled on medication adherence and adverse effects. - Follow-up with primary provider in 3 months or sooner if needed.  - amLODipine (NORVASC) 5 MG tablet; Take 1 tablet (5 mg total) by mouth daily.  Dispense: 90 tablet; Refill: 0 - losartan (COZAAR) 50 MG tablet; Take 1 tablet (50 mg total) by mouth daily.  Dispense: 90 tablet; Refill: 0  2. Type 2 diabetes mellitus with hyperglycemia, without long-term current use of insulin (HCC) - Continue Metformin XR as prescribed.  - Routine screening.  - Discussed the importance of healthy eating habits, low-carbohydrate diet, low-sugar diet, regular aerobic exercise (at least 150 minutes a week as tolerated) and medication compliance to achieve or maintain control of diabetes. - Follow-up  with primary provider as scheduled.  - metFORMIN (GLUCOPHAGE-XR) 500 MG 24 hr tablet; Take 1 tablet (500 mg total) by mouth 2 (two) times daily with a meal.  Dispense: 180 tablet; Refill: 0 - POCT glycosylated hemoglobin (Hb A1C); Future  3. Diabetic eye exam [facility]) - Referral to Ophthalmology for evaluation/management.  - Ambulatory referral to Ophthalmology  4. Encounter for diabetic foot exam [facility]) - Referral to Podiatry for evaluation/management.  - Ambulatory referral  to Podiatry  5. Hyperlipidemia, unspecified hyperlipidemia type - Continue Atorvastatin as prescribed. Counseled on medication adherence. - Follow-up with primary provider in 3 months or sooner if needed.  - atorvastatin (LIPITOR) 80 MG tablet; Take 1 tablet (80 mg total) by mouth daily.  Dispense: 90 tablet; Refill: 0    Patient was given the opportunity to ask questions.  Patient verbalized understanding of the plan and was able to repeat key elements of the plan. Patient was given clear instructions to go to Emergency Department or return to medical center if symptoms don't improve, worsen, or new problems develop.The patient verbalized understanding.   Orders Placed This Encounter  Procedures   Ambulatory referral to Ophthalmology   Ambulatory referral to Podiatry   POCT glycosylated hemoglobin (Hb A1C)     Requested Prescriptions   Signed Prescriptions Disp Refills   amLODipine (NORVASC) 5 MG tablet 90 tablet 0    Sig: Take 1 tablet (5 mg total) by mouth daily.   atorvastatin (LIPITOR) 80 MG tablet 90 tablet 0    Sig: Take 1 tablet (80 mg total) by mouth daily.   losartan (COZAAR) 50 MG tablet 90 tablet 0    Sig: Take 1 tablet (50 mg total) by mouth daily.   metFORMIN (GLUCOPHAGE-XR) 500 MG 24 hr tablet 180 tablet 0    Sig: Take 1 tablet (500 mg total) by mouth 2 (two) times daily with a meal.    Return in about 3 months (around 03/09/2023) for Follow-Up or next available chronic conditions.  Rema Fendt, NP

## 2022-12-27 ENCOUNTER — Telehealth: Payer: Self-pay | Admitting: Family

## 2022-12-27 NOTE — Telephone Encounter (Signed)
Copied from CRM [id number]. Topic: General - Inquiry >> Dec 27, 2022  1:05 PM Patsy Lager T wrote: Reason for CRM: patient called requesting provider call her about the podiatrist. She refused to give anymore info

## 2022-12-29 NOTE — Telephone Encounter (Signed)
Call patient with update. Please call patient for additional details. It is noted that Podiatry contacted patient twice to schedule appointment. Referral information listed below:  [address] [place], [place] [address] [phone]

## 2022-12-29 NOTE — Telephone Encounter (Signed)
Patient said she work 12 hr shifts and she will give them a call later

## 2023-03-23 ENCOUNTER — Other Ambulatory Visit: Payer: Self-pay | Admitting: Family

## 2023-03-23 DIAGNOSIS — I10 Essential (primary) hypertension: Secondary | ICD-10-CM

## 2023-03-26 ENCOUNTER — Other Ambulatory Visit: Payer: Self-pay

## 2023-03-26 DIAGNOSIS — I10 Essential (primary) hypertension: Secondary | ICD-10-CM

## 2023-03-26 MED ORDER — AMLODIPINE BESYLATE 5 MG PO TABS
5.0000 mg | ORAL_TABLET | Freq: Every day | ORAL | 0 refills | Status: AC
Start: 2023-03-26 — End: ?

## 2023-03-26 MED ORDER — LOSARTAN POTASSIUM 50 MG PO TABS: 50.0000 mg | ORAL_TABLET | Freq: Every day | ORAL | 0 refills | Status: AC

## 2023-04-04 ENCOUNTER — Encounter: Payer: Self-pay | Admitting: Family

## 2023-04-04 NOTE — Progress Notes (Signed)
 Erroneous encounter-disregard

## 2023-05-05 ENCOUNTER — Other Ambulatory Visit: Payer: Self-pay | Admitting: Family

## 2023-05-05 DIAGNOSIS — E785 Hyperlipidemia, unspecified: Secondary | ICD-10-CM

## 2023-05-07 ENCOUNTER — Other Ambulatory Visit: Payer: Self-pay

## 2023-05-07 DIAGNOSIS — E785 Hyperlipidemia, unspecified: Secondary | ICD-10-CM

## 2023-05-07 MED ORDER — ATORVASTATIN CALCIUM 80 MG PO TABS: 80.0000 mg | ORAL_TABLET | Freq: Every day | ORAL | 0 refills | Status: AC

## 2023-06-11 ENCOUNTER — Other Ambulatory Visit: Payer: Self-pay | Admitting: Physician Assistant

## 2023-06-11 DIAGNOSIS — F17210 Nicotine dependence, cigarettes, uncomplicated: Secondary | ICD-10-CM

## 2023-06-14 ENCOUNTER — Other Ambulatory Visit: Payer: Self-pay | Admitting: Physician Assistant

## 2023-06-14 DIAGNOSIS — Z1231 Encounter for screening mammogram for malignant neoplasm of breast: Secondary | ICD-10-CM

## 2023-07-06 IMAGING — MG MM BREAST LOCALIZATION CLIP
8 of 12 series · 9 of 32 positions shown · non-contrast
Comparison: Previous exam(s).

CLINICAL DATA: Status post stereotactic biopsy of LEFT breast
calcifications.

EXAM:
3D DIAGNOSTIC LEFT MAMMOGRAM POST STEREOTACTIC BIOPSY

[R CC (1 of 2)]
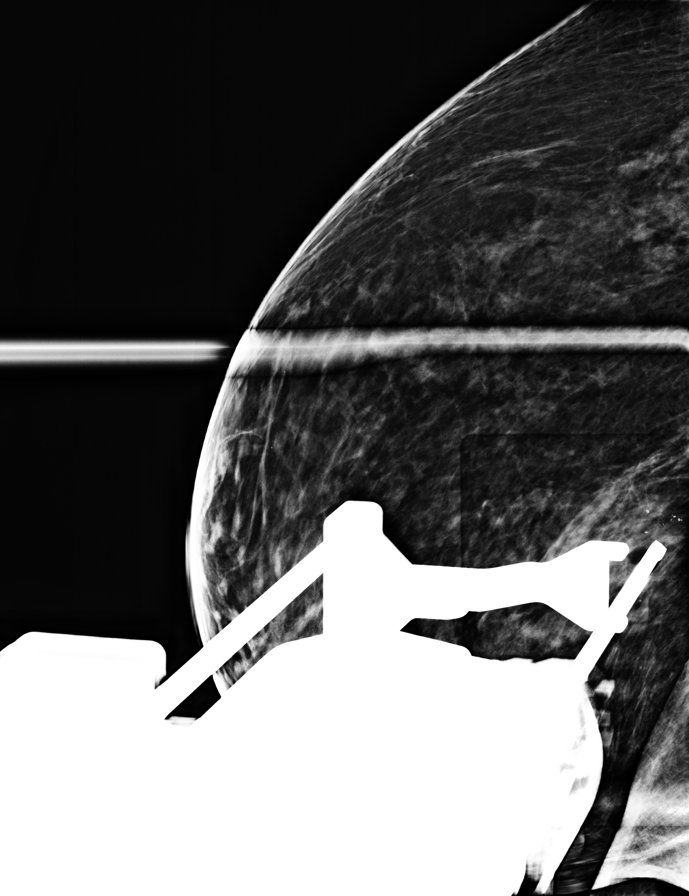

[L LM (1 of 3)]
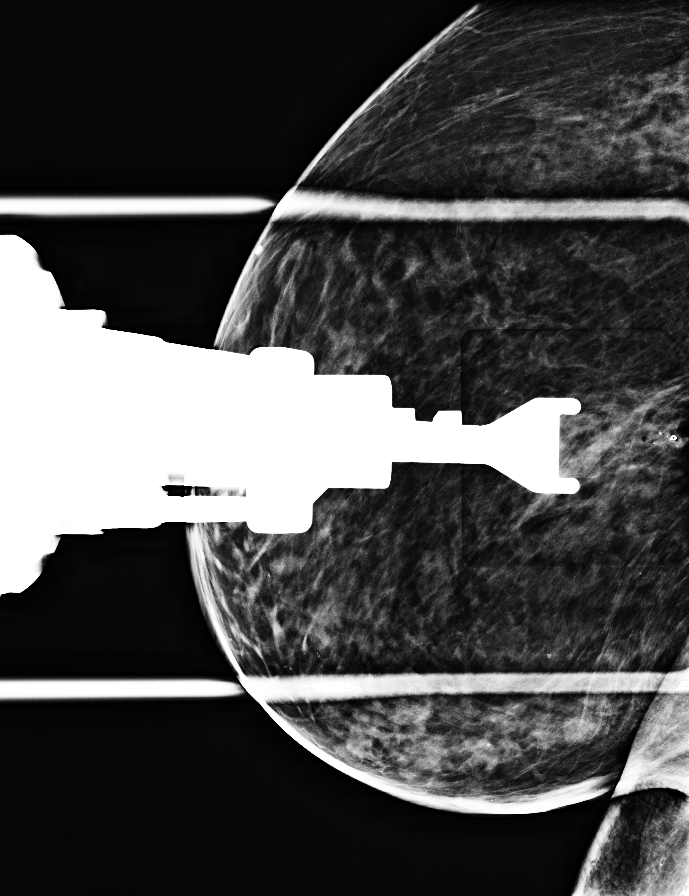

[L LM (2 of 3)]
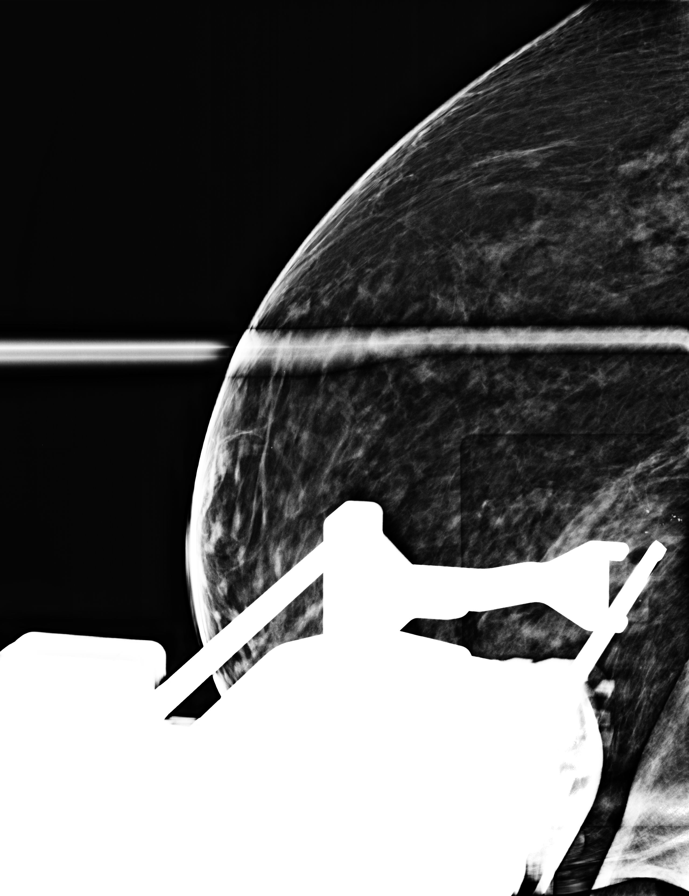

[R CC (2 of 2)]
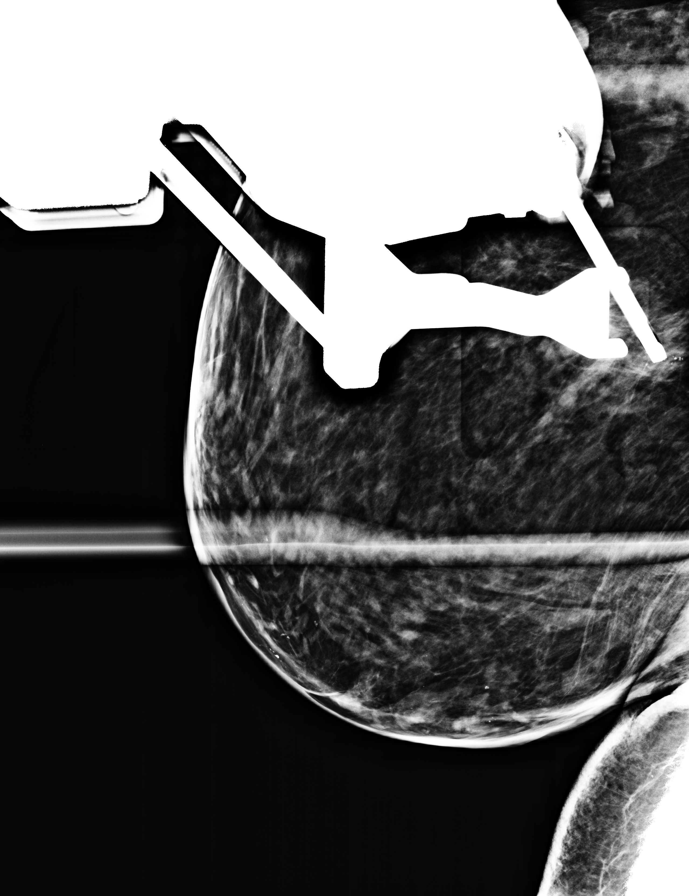

[L LM (3 of 3)]
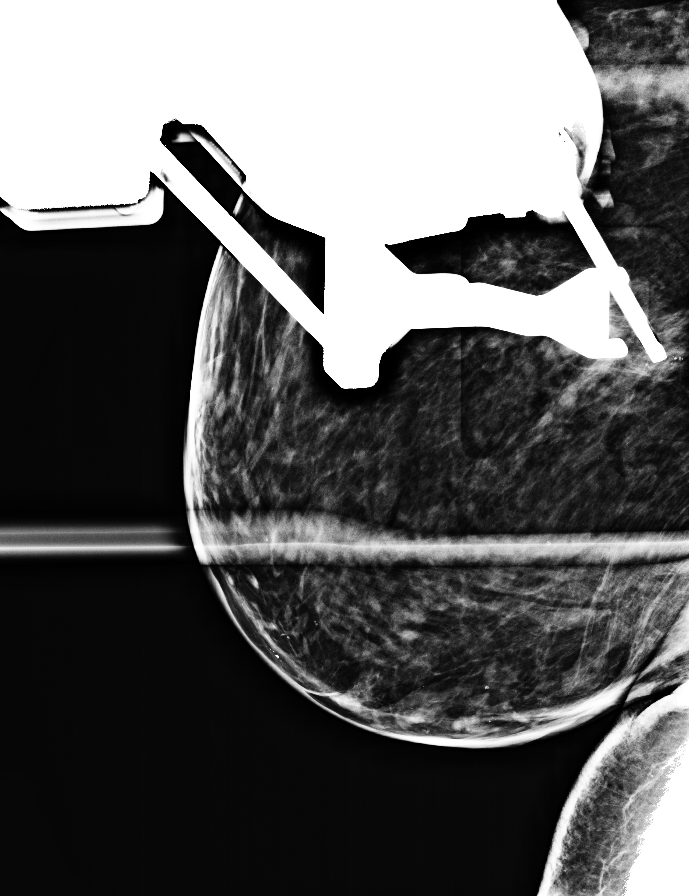

[L CC synth-2D]
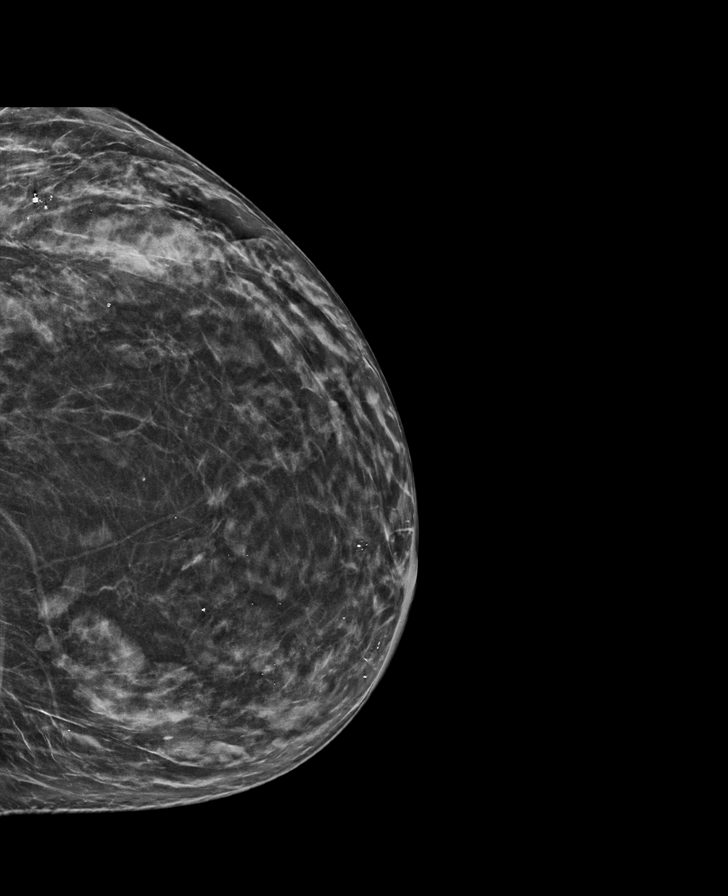

[L LM synth-2D]
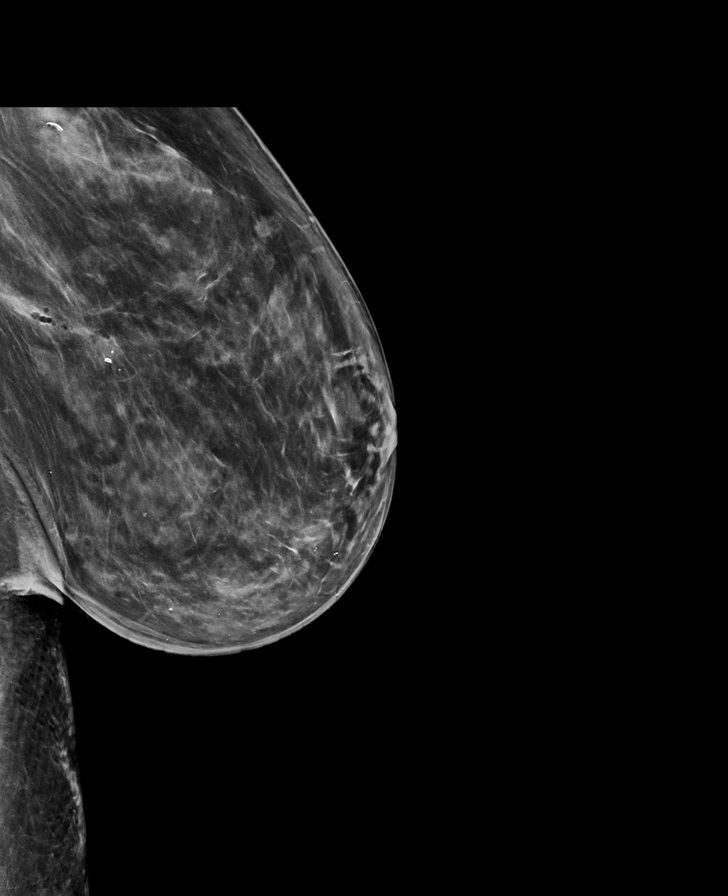

[L LM tomo · 2 of 106 frames shown]
[frame 35/106]
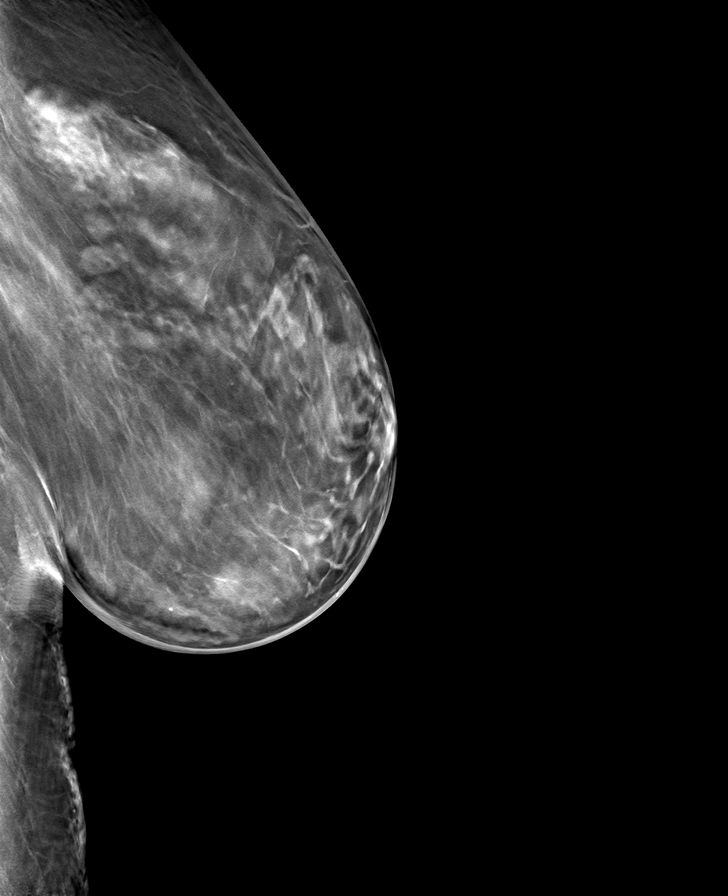
[frame 53/106]
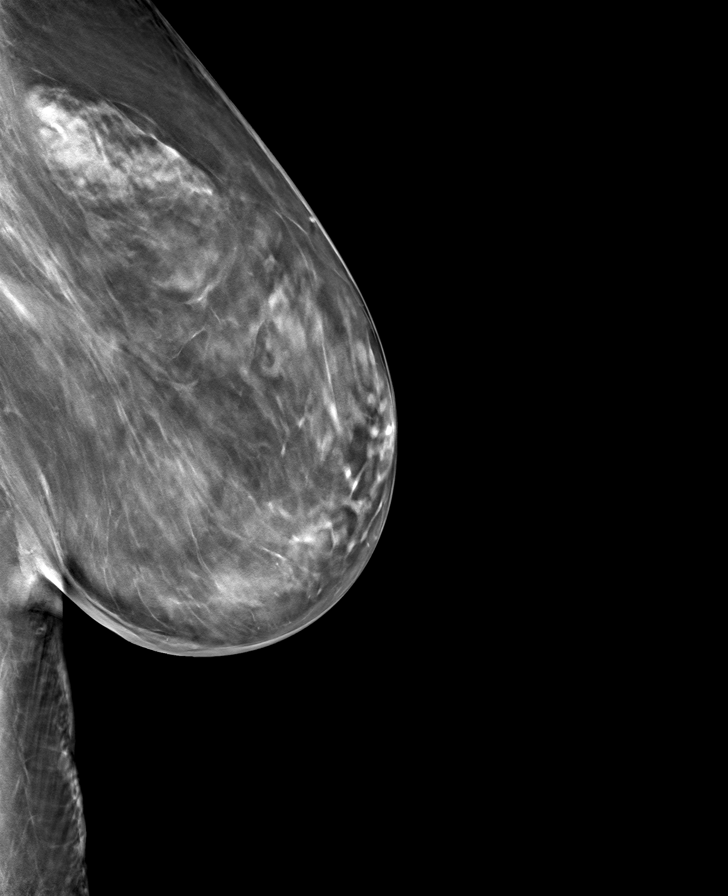

[9 of 32 positions shown; findings below may reference images not displayed]

FINDINGS: 3D Mammographic images were obtained following stereotactic guided
biopsy of calcifications in the LATERAL portion of the LEFT breast
and placement of a coil shaped clip. The bi opsy marking clip is in
expected position at the site of biopsy.
IMPRESSION: Appropriate positioning of the coil shaped biopsy marking clip at
the site of biopsy in the LATERAL LEFT breast.

Final Assessment: Post Procedure Mammograms for Marker Placement
